# Patient Record
Sex: Female | Born: 1935 | Race: White | Hispanic: No | Smoking: Former smoker
Health system: Southern US, Community
[De-identification: ages and names within clinical notes are randomized; demographics above are authoritative.]

## PROBLEM LIST (undated history)

## (undated) DIAGNOSIS — K219 Gastro-esophageal reflux disease without esophagitis: Secondary | ICD-10-CM

## (undated) DIAGNOSIS — Q639 Congenital malformation of kidney, unspecified: Secondary | ICD-10-CM

## (undated) DIAGNOSIS — R0602 Shortness of breath: Secondary | ICD-10-CM

## (undated) DIAGNOSIS — C50919 Malignant neoplasm of unspecified site of unspecified female breast: Secondary | ICD-10-CM

## (undated) DIAGNOSIS — I214 Non-ST elevation (NSTEMI) myocardial infarction: Secondary | ICD-10-CM

## (undated) DIAGNOSIS — E119 Type 2 diabetes mellitus without complications: Secondary | ICD-10-CM

## (undated) DIAGNOSIS — G709 Myoneural disorder, unspecified: Secondary | ICD-10-CM

## (undated) DIAGNOSIS — I10 Essential (primary) hypertension: Secondary | ICD-10-CM

## (undated) DIAGNOSIS — M1990 Unspecified osteoarthritis, unspecified site: Secondary | ICD-10-CM

## (undated) DIAGNOSIS — F329 Major depressive disorder, single episode, unspecified: Secondary | ICD-10-CM

## (undated) DIAGNOSIS — J449 Chronic obstructive pulmonary disease, unspecified: Secondary | ICD-10-CM

## (undated) DIAGNOSIS — F32A Depression, unspecified: Secondary | ICD-10-CM

## (undated) HISTORY — PX: BREAST LUMPECTOMY: SHX2

## (undated) HISTORY — PX: CHOLECYSTECTOMY: SHX55

## (undated) HISTORY — PX: BACK SURGERY: SHX140

## (undated) HISTORY — PX: VULVECTOMY PARTIAL: SHX6187

---

## 1998-05-19 ENCOUNTER — Emergency Department (HOSPITAL_COMMUNITY): Admission: EM | Admit: 1998-05-19 | Discharge: 1998-05-19 | Payer: Self-pay | Admitting: Emergency Medicine

## 1998-05-20 ENCOUNTER — Encounter: Payer: Self-pay | Admitting: Gastroenterology

## 1998-05-20 ENCOUNTER — Ambulatory Visit (HOSPITAL_COMMUNITY): Admission: RE | Admit: 1998-05-20 | Discharge: 1998-05-20 | Payer: Self-pay | Admitting: Gastroenterology

## 1999-01-11 ENCOUNTER — Emergency Department (HOSPITAL_COMMUNITY): Admission: EM | Admit: 1999-01-11 | Discharge: 1999-01-11 | Payer: Self-pay | Admitting: Emergency Medicine

## 1999-07-29 ENCOUNTER — Encounter: Admission: RE | Admit: 1999-07-29 | Discharge: 1999-07-29 | Payer: Self-pay | Admitting: Internal Medicine

## 1999-07-29 ENCOUNTER — Encounter: Payer: Self-pay | Admitting: Internal Medicine

## 1999-10-28 ENCOUNTER — Encounter: Admission: RE | Admit: 1999-10-28 | Discharge: 2000-01-26 | Payer: Self-pay | Admitting: Internal Medicine

## 2001-02-17 ENCOUNTER — Other Ambulatory Visit: Admission: RE | Admit: 2001-02-17 | Discharge: 2001-02-17 | Payer: Self-pay | Admitting: Internal Medicine

## 2001-02-21 ENCOUNTER — Encounter: Admission: RE | Admit: 2001-02-21 | Discharge: 2001-02-21 | Payer: Self-pay | Admitting: Internal Medicine

## 2001-02-21 ENCOUNTER — Encounter: Payer: Self-pay | Admitting: Internal Medicine

## 2002-02-22 ENCOUNTER — Encounter: Payer: Self-pay | Admitting: Internal Medicine

## 2002-02-22 ENCOUNTER — Encounter: Admission: RE | Admit: 2002-02-22 | Discharge: 2002-02-22 | Payer: Self-pay | Admitting: Internal Medicine

## 2003-02-25 ENCOUNTER — Encounter: Admission: RE | Admit: 2003-02-25 | Discharge: 2003-02-25 | Payer: Self-pay | Admitting: Internal Medicine

## 2003-07-16 ENCOUNTER — Other Ambulatory Visit: Admission: RE | Admit: 2003-07-16 | Discharge: 2003-07-16 | Payer: Self-pay | Admitting: Internal Medicine

## 2004-03-03 ENCOUNTER — Encounter: Admission: RE | Admit: 2004-03-03 | Discharge: 2004-03-03 | Payer: Self-pay | Admitting: Internal Medicine

## 2004-06-13 ENCOUNTER — Observation Stay (HOSPITAL_COMMUNITY): Admission: EM | Admit: 2004-06-13 | Discharge: 2004-06-14 | Payer: Self-pay | Admitting: Emergency Medicine

## 2005-03-29 ENCOUNTER — Encounter: Admission: RE | Admit: 2005-03-29 | Discharge: 2005-03-29 | Payer: Self-pay | Admitting: Internal Medicine

## 2005-08-25 ENCOUNTER — Other Ambulatory Visit: Admission: RE | Admit: 2005-08-25 | Discharge: 2005-08-25 | Payer: Self-pay | Admitting: Internal Medicine

## 2006-04-05 ENCOUNTER — Encounter: Admission: RE | Admit: 2006-04-05 | Discharge: 2006-04-05 | Payer: Self-pay | Admitting: Internal Medicine

## 2006-04-11 ENCOUNTER — Inpatient Hospital Stay (HOSPITAL_COMMUNITY): Admission: EM | Admit: 2006-04-11 | Discharge: 2006-04-13 | Payer: Self-pay | Admitting: Emergency Medicine

## 2006-06-08 ENCOUNTER — Encounter: Admission: RE | Admit: 2006-06-08 | Discharge: 2006-06-08 | Payer: Self-pay | Admitting: Internal Medicine

## 2008-02-14 ENCOUNTER — Encounter: Admission: RE | Admit: 2008-02-14 | Discharge: 2008-02-14 | Payer: Self-pay | Admitting: Internal Medicine

## 2009-02-14 ENCOUNTER — Encounter: Admission: RE | Admit: 2009-02-14 | Discharge: 2009-02-14 | Payer: Self-pay | Admitting: Internal Medicine

## 2010-05-10 ENCOUNTER — Encounter: Payer: Self-pay | Admitting: Internal Medicine

## 2010-09-04 NOTE — H&P (Signed)
NAMEVERGINIA, TOOHEY NO.:  [id number]   MEDICAL RECORD NO.:  [id number]          PATIENT TYPE:  EMS   LOCATION:  ED                           FACILITY:  [facility]   PHYSICIAN:  Theressa Millard, M.D.    DATE OF BIRTH:  11/06/1927   DATE OF ADMISSION:  06/13/2004  DATE OF DISCHARGE:                                HISTORY & PHYSICAL   Ms. Golz is a very nice 77 year old white female with some left arm,  face, and leg symptoms.   For the last six weeks she has had a nightly pins and needles sensation in  the left upper arm after supper.  It would last from 7 p.m. till bedtime and  it is always gone the next day.  She swims four times a week and has never  had any of this discomfort while exercising.  Last night she noted the  discomfort was more burning in nature in the arm and she also had noted a  pins and needles sensation in the left face and the left leg.  She came to  the emergency department the sensation was getting better.  When she was  given nitroglycerin, the discomfort went away.  Emergency department  physician thought this could be her heart.  EKG with discomfort was  negative.  Serial troponins are negative.  Cardiovascular risk factors  include age, diabetes, hypertension, and former smoking status (quit 1996).   PAST MEDICAL HISTORY:   MEDICAL:  1.  Diabetes.  2.  Hypertension.  3.  Hypercholesterolemia.  4.  Asthma.  5.  Bell palsy.  6.  Shingles.   MEDICATIONS:  1.  Metformin 500 mg b.i.d.  2.  Triamterene/HCTZ 75/50 daily.  3.  Zocor 80 mg one half tablet daily.  4.  Advair 250/50 b.i.d.  She stopped taking aspirin one week ago because she read in Prevention  Magazine that it was dangerous..   SURGERY:  1.  Vulvectomy for vulvar cancer.  2.  Breast lumpectomy for breast cancer (subsequent radiation therapy).  3.  Cholecystectomy.  4.  Tonsillectomy.   No known drug allergies.   SOCIAL HISTORY:  Smoking, quit 1996.  Alcohol, quit  ten years ago.  Been  widowed x 10 years.   FAMILY HISTORY:  No heart disease.  Positive for hypertension.   REVIEW OF SYSTEMS:  Otherwise negative.   PHYSICAL EXAMINATION:  GENERAL:  Well-developed, well-nourished, in no  distress.  VITAL SIGNS:  Blood pressure 115/50, pulse 67.  HEENT:  She does have pupils which are round, reactive to light.  The  extraocular movements are intact.  Funduscopic exam is normal.  Ears are  normal.  Nose and throat are unremarkable.  NECK:  Supple.  Thyroid is not enlarged or tender.  Neck reveals no bruits.  CHEST:  Clear to auscultation percussion.  CARDIAC:  Regular rhythm rate with a normal S1 and S2 without an S3, S4,  murmur, rub, or click.  ABDOMEN:  Soft and nontender with normal bowel sounds.  EXTREMITIES:  Without cyanosis, clubbing, or edema.  NEUROLOGIC:  Alert and oriented x  3.  Cranial nerves are intact.  Motor is  5/5.   LABORATORY DATA:  Troponin x 3 is negative.  Glucose 143, otherwise normal C-  MET.  CBC is negative.   IMPRESSION:  1.  Recurrent left arm tingling with radiation to the jaw and leg.  I think      this is very unlikely to be her heart since it radiated to her leg, it      lasts for hours on a repetitive basis, and is not exertional.  I am not      sure if we will find a cause but it is more likely cerebrovascular if      anything.  We will obtain an MRI/MRA and carotid Doppler studies and add      aspirin today.  2.  Diabetes.  3.  Hypertension.  4.  Asthma.      JO/MEDQ  D:  06/13/2004  T:  06/13/2004  Job:  [id number]

## 2010-09-04 NOTE — H&P (Signed)
NAMEANALUCIA, HUSH              ACCOUNT NO.:  [id number]   MEDICAL RECORD NO.:  [id number]          PATIENT TYPE:  INP   LOCATION:  0103                         FACILITY:  [facility]   PHYSICIAN:  Hal T. Stoneking, M.D. DATE OF BIRTH:  August 12, 1927   DATE OF ADMISSION:  04/11/2006  DATE OF DISCHARGE:                              HISTORY & PHYSICAL   IDENTIFYING DATA:  [name] is a very nice 77 year old white female  who has had a background history of COPD.  She stop smoking a number of  years ago. She was in her usual state of health until 3-4 days ago when  she was becoming more dyspneic, had a cough. Was seen in the office,  placed on Xopenex and a Z-Pak.  Since then, she has had gradual  worsening dyspnea, coughing up whitish sputum.  She has felt warm at  times but no definite fever.  She states that she gets short of breath  with really just sitting up or walking very short distances.   ALLERGIES:  She has had no known drug allergies   CURRENT MEDICATIONS:  1. Pepcid AC one p.o. b.i.d.  2. Metformin 500 mg p.o. b.i.d.  3. Zocor 40 mg daily.  4. Advair 250/50 b.i.d..  5. Aspirin 325 once a day.  6. Diovan.  7. Hydrochlorothiazide 160/25 once a day.  8. Lexapro 5 mg a day.   PAST MEDICAL HISTORY:  Remarkable for:  1. Diabetes mellitus.  2. Hypercholesterolemia.  3. COPD.  4. Hypertension.  5. History of left breast cancer.  6. GERD.  7. Depression   PREVIOUS SURGERIES:  1. She had a vulvectomy for vulvar cancer, followed at [facility], original      surgery 7 years ago.  2. Lumbar fusion in 1993.  3. Temporal artery biopsy in 1993, positive for giant cell arteritis.  4. History of cholecystectomy in 1996.   FAMILY HISTORY:  Remarkable for hypertension.   SOCIAL HISTORY:  She is originally from [place].  She has been widowed  12 years. Daughter lives here in [place]   REVIEW OF SYSTEMS:  No change in her vision.  No change in her hearing.  No trouble chewing or  swallowing.  No abdominal pain.  Bowels moving  normally.  No dysuria.   PHYSICAL EXAMINATION:  VITAL SIGNS: Blood pressure 120/55, pulse rate  87, O2 saturation 97%, temperature 98.2.  SKIN: Warm and dry.  HEENT: Pupils equal, round, reactive to light. Poorly visualized fundi.  TMs appear normal.  Oropharynx moist.  LUNGS: Bilateral expiratory wheezes.  HEART: Regular rate and rhythm without murmur.  ABDOMEN: Soft, nontender.  No pedal splenomegaly or masses palpated.  No  edema.   LABORATORY DATA:  Chest x-ray reveals no active disease.   White count 7200 with 72% neutrophils, 17% lymphs, hemoglobin 13.1,  platelet count 307,000.  Sodium 138, potassium 3.6, chloride 97, bicarb  31, glucose 107, BUN 20, creatinine 0.9, calcium 9.2   ASSESSMENT:  Chronic obstructive pulmonary disease exacerbation, unclear  of what the trigger is. At this point, it does not appear to be  infectious  but, due to the degree of wheezing, will continue to cover  her and change to Levaquin.  Will continue Xopenex and will start her on  IV Solu-Medrol.   PLAN:  Admit as mentioned above.           ______________________________  Hal T. Pete Glatter, M.D.     HTS/MEDQ  D:  04/11/2006  T:  04/11/2006  Job:  [id number]

## 2010-09-04 NOTE — Discharge Summary (Signed)
[name], [name]              ACCOUNT NO.:  [id number]   MEDICAL RECORD NO.:  [id number]          PATIENT TYPE:  INP   LOCATION:  0484                         FACILITY:  [facility]   PHYSICIAN:  Candyce Churn, M.D.DATE OF BIRTH:  1928-01-14   DATE OF ADMISSION:  06/13/2004  DATE OF DISCHARGE:  06/14/2004                                 DISCHARGE SUMMARY   DISCHARGE DIAGNOSES:  1.  Transient ischemic attack.  2.  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  3.  Type 2 diabetes mellitus.  4.  Hypertension.  5.  History of hypercholesterolemia.  6.  Chronic obstructive pulmonary disease/asthma.  7.  History of Bell's palsy.  8.  History of shingles.   DISCHARGE MEDICATIONS:  1.  Metformin 500 mg p.o. b.i.d.  2.  Maxzide 75/51 daily.  3.  Zocor 80 mg 1/2 tablet daily.  4.  Advair 250/50 1 inhalation b.i.d.  5.  Aspirin 325 mg 1 daily.   PROCEDURES:  1.  MRI/MRA of the brain -- signs of mild atrophy.  Only abnormality was a      mild to moderate stenosis in the posterior branch of the right middle      cerebral artery.  There were scattered 3-4 mm white matter hyper      intensity bilaterally with the appearance of chronic infarcts.  No      evidence of metastatic disease.  Brain sinuses were clear.  2.  Carotid artery Dopplers -- right moderate diffuse disease throughout,      left moderate calcific plaque bilaterally but no ICA stenosis      bilaterally.  Vertebral artery was antegrade.  She did have a left      external carotid artery stenosis.   LABORATORY DATA:  June 13, 2004.  Sodium 134, potassium 2.9, chloride  101, bicarb 27, glucose 143, BUN 18, creatinine 1.2, calcium 9.2.  LFTs  within normal limits.  Albumin 3.5.  CBC:  White count 7,800, hemoglobin  12.8, platelet count 315,000, normal differential with 57% neutrophils.  CK  x3 was 67, 57, and 59 respectively.  All Troponins were less than 0.05.  EKG on admission revealed  sinus rhythm at 79 beats per minute and normal  EKG, normal intervals and axes.   HOSPITAL COURSE:  [name] is a very pleasant 77 year old female  with the above medical history who presented with pins and needles sensation  in her left upper arm.  These had been occurring intermittently over about 6  weeks.  She noticed a burning discomfort in her arm, left face and left leg.  On the night prior to admission she came to the emergency room for  evaluation.  She was given nitroglycerine and discomfort went away.  Serial  enzymes have been negative, EKG was normal, Troponins were negative.   She has been asymptomatic for 24 hours in the hospital with the addition of  aspirin added to her medical regimen.  She has been off it for about a week.   She has no current complaints and I suspect that  she was having some TIA  symptoms, probably from the non use of her aspirin.  We will restart this  daily.  She will notify me if she starts to develop any dyspeptic symptoms.  We will plan to see her back in the office for routine followup in 3 or 4  weeks.      RNG/MEDQ  D:  06/14/2004  T:  06/14/2004  Job:  [id number]

## 2010-09-04 NOTE — Discharge Summary (Signed)
NAMEASHAYA, RAFTERY              ACCOUNT NO.:  [id number]   MEDICAL RECORD NO.:  [id number]          PATIENT TYPE:  INP   LOCATION:  1422                         FACILITY:  [facility]   PHYSICIAN:  Georgann Housekeeper, MD      DATE OF BIRTH:  1927-10-11   DATE OF ADMISSION:  04/11/2006  DATE OF DISCHARGE:  04/13/2006                               DISCHARGE SUMMARY   DISCHARGE DIAGNOSIS:  1. Chronic obstructive pulmonary disease exacerbation.  2. Probable bronchitis.  3. Diabetes, mildly elevated blood sugar.  4. History of hypertension.  5. History of gastroesophageal reflux disease.   DISCHARGE MEDICATIONS:  1. Metformin 500 mg t.i.d.  2. Zocor 40 mg __________.  3. Advair 250/50 t.i.d.  4. Pepcid AC 2 tablets per day.  5. Diovan 160/25 one a day.  6. Lexapro 5 mg daily.  7. Nebulizer Atrovent albuterol 4 times a day.  8. Prednisone 60 mg taper d own over the next one week.  9. Oxygen 2 liters nasal cannula as needed.  10.Mucinex one tablet twice a day over-the-counter.  11.Levaquin 500 mg once a day for 7 days.   HOSPITAL COURSE:  A 77 year old female with history of COPD who was  treated outpatient with Xopenex and Z-pack, continues to worsen with  shortness of breath and wheezing and hypoxia.   Problem 1:  Pulmonary.  The patient was admitted with COPD exacerbation.  Chest x-ray did not show any pneumonia, however, bronchitis.  She did  not have any fevers.  Normal white count.  The patient was started on IV  Solu-Medrol and nebulizers and O2, continues to improve.  She was put on  Levaquin antibiotic for possible bronchitis and also Mucinex.  As far as  the patient continues to have some wheezing, but improved significantly.  Her oxygen remains on 2 liters, 96%.  Laboratory data:  White count of  7.2, creatinine 0.9, BUN of 20.  Sodium 138, potassium 3.6.   Problem 2:  Type 2 diabetes on metformin.  Because of the steroids,  blood sugars are around 200.  Metformin was  increased to 3 times a day  while she is on steroids and follow up with sugar outpatient.   Problem 3:  As far as her hypertension remains stable, continue on  Diovan HCT.  Follow in one week with Dr. Kevan Ny at the office.      Georgann Housekeeper, MD  Electronically Signed     KH/MEDQ  D:  04/13/2006  T:  04/13/2006  Job:  [id number]

## 2012-06-21 ENCOUNTER — Emergency Department (HOSPITAL_COMMUNITY): Payer: Medicare Other

## 2012-06-21 ENCOUNTER — Encounter (HOSPITAL_COMMUNITY): Payer: Self-pay | Admitting: Emergency Medicine

## 2012-06-21 ENCOUNTER — Other Ambulatory Visit: Payer: Self-pay

## 2012-06-21 ENCOUNTER — Inpatient Hospital Stay (HOSPITAL_COMMUNITY)
Admission: EM | Admit: 2012-06-21 | Discharge: 2012-06-28 | DRG: 193 | Disposition: A | Discharge status: Skilled Nursing Facility | Payer: Medicare Other | Attending: Internal Medicine | Admitting: Internal Medicine

## 2012-06-21 DIAGNOSIS — IMO0002 Reserved for concepts with insufficient information to code with codable children: Secondary | ICD-10-CM

## 2012-06-21 DIAGNOSIS — T50995A Adverse effect of other drugs, medicaments and biological substances, initial encounter: Secondary | ICD-10-CM | POA: Diagnosis present

## 2012-06-21 DIAGNOSIS — J9620 Acute and chronic respiratory failure, unspecified whether with hypoxia or hypercapnia: Secondary | ICD-10-CM

## 2012-06-21 DIAGNOSIS — D72829 Elevated white blood cell count, unspecified: Secondary | ICD-10-CM

## 2012-06-21 DIAGNOSIS — I10 Essential (primary) hypertension: Secondary | ICD-10-CM

## 2012-06-21 DIAGNOSIS — F19982 Other psychoactive substance use, unspecified with psychoactive substance-induced sleep disorder: Secondary | ICD-10-CM | POA: Diagnosis not present

## 2012-06-21 DIAGNOSIS — F411 Generalized anxiety disorder: Secondary | ICD-10-CM | POA: Diagnosis present

## 2012-06-21 DIAGNOSIS — E1165 Type 2 diabetes mellitus with hyperglycemia: Secondary | ICD-10-CM | POA: Diagnosis present

## 2012-06-21 DIAGNOSIS — Z87891 Personal history of nicotine dependence: Secondary | ICD-10-CM

## 2012-06-21 DIAGNOSIS — R931 Abnormal findings on diagnostic imaging of heart and coronary circulation: Secondary | ICD-10-CM | POA: Diagnosis present

## 2012-06-21 DIAGNOSIS — I214 Non-ST elevation (NSTEMI) myocardial infarction: Secondary | ICD-10-CM

## 2012-06-21 DIAGNOSIS — J441 Chronic obstructive pulmonary disease with (acute) exacerbation: Secondary | ICD-10-CM

## 2012-06-21 DIAGNOSIS — IMO0001 Reserved for inherently not codable concepts without codable children: Secondary | ICD-10-CM | POA: Diagnosis present

## 2012-06-21 DIAGNOSIS — R0603 Acute respiratory distress: Secondary | ICD-10-CM

## 2012-06-21 DIAGNOSIS — Z9981 Dependence on supplemental oxygen: Secondary | ICD-10-CM

## 2012-06-21 DIAGNOSIS — G2581 Restless legs syndrome: Secondary | ICD-10-CM | POA: Diagnosis present

## 2012-06-21 DIAGNOSIS — K1379 Other lesions of oral mucosa: Secondary | ICD-10-CM

## 2012-06-21 DIAGNOSIS — G4700 Insomnia, unspecified: Secondary | ICD-10-CM | POA: Diagnosis present

## 2012-06-21 DIAGNOSIS — R7989 Other specified abnormal findings of blood chemistry: Secondary | ICD-10-CM

## 2012-06-21 DIAGNOSIS — J189 Pneumonia, unspecified organism: Secondary | ICD-10-CM

## 2012-06-21 DIAGNOSIS — Z79899 Other long term (current) drug therapy: Secondary | ICD-10-CM

## 2012-06-21 DIAGNOSIS — E785 Hyperlipidemia, unspecified: Secondary | ICD-10-CM | POA: Diagnosis present

## 2012-06-21 DIAGNOSIS — Z7901 Long term (current) use of anticoagulants: Secondary | ICD-10-CM

## 2012-06-21 HISTORY — DX: Chronic obstructive pulmonary disease, unspecified: J44.9

## 2012-06-21 HISTORY — DX: Type 2 diabetes mellitus without complications: E11.9

## 2012-06-21 HISTORY — DX: Essential (primary) hypertension: I10

## 2012-06-21 HISTORY — DX: Malignant neoplasm of unspecified site of unspecified female breast: C50.919

## 2012-06-21 HISTORY — DX: Congenital malformation of kidney, unspecified: Q63.9

## 2012-06-21 HISTORY — DX: Non-ST elevation (NSTEMI) myocardial infarction: I21.4

## 2012-06-21 HISTORY — DX: Shortness of breath: R06.02

## 2012-06-21 LAB — BLOOD GAS, ARTERIAL
Bicarbonate: 22.1 mEq/L (ref 20.0–24.0)
TCO2: 23.2 mmol/L (ref 0–100)
pCO2 arterial: 38.6 mmHg (ref 35.0–45.0)
pH, Arterial: 7.375 (ref 7.350–7.450)

## 2012-06-21 LAB — COMPREHENSIVE METABOLIC PANEL
CO2: 26 mEq/L (ref 19–32)
Calcium: 9.6 mg/dL (ref 8.4–10.5)
GFR calc Af Amer: 50 mL/min — ABNORMAL LOW (ref >90)
Potassium: 4.1 mEq/L (ref 3.5–5.1)
Sodium: 136 mEq/L (ref 135–145)
Total Bilirubin: 0.8 mg/dL (ref 0.3–1.2)
Total Protein: 7.2 g/dL (ref 6.0–8.3)

## 2012-06-21 LAB — GLUCOSE, CAPILLARY
Glucose-Capillary: 265 mg/dL — ABNORMAL HIGH (ref 70–99)
Glucose-Capillary: 341 mg/dL — ABNORMAL HIGH (ref 70–99)
Glucose-Capillary: 270 mg/dL — ABNORMAL HIGH (ref 70–99)

## 2012-06-21 LAB — CBC WITH DIFFERENTIAL/PLATELET
Basophils Absolute: 0.1 10*3/uL (ref 0.0–0.1)
Basophils Relative: 0 % (ref 0–1)
Eosinophils Relative: 1 % (ref 0–5)
HCT: 37.5 % (ref 36.0–46.0)
MCH: 29.5 pg (ref 26.0–34.0)
Monocytes Absolute: 1.3 10*3/uL — ABNORMAL HIGH (ref 0.1–1.0)
Neutro Abs: 14.1 10*3/uL — ABNORMAL HIGH (ref 1.7–7.7)
Neutrophils Relative %: 74 % (ref 43–77)
RBC: 4.31 MIL/uL (ref 3.87–5.11)

## 2012-06-21 LAB — POCT I-STAT 3, ART BLOOD GAS (G3+)
Bicarbonate: 27.4 mEq/L — ABNORMAL HIGH (ref 20.0–24.0)
O2 Saturation: 98 %
TCO2: 29 mmol/L (ref 0–100)
pCO2 arterial: 56.5 mmHg — ABNORMAL HIGH (ref 35.0–45.0)
pH, Arterial: 7.294 — ABNORMAL LOW (ref 7.350–7.450)
pO2, Arterial: 128 mmHg — ABNORMAL HIGH (ref 80.0–100.0)

## 2012-06-21 LAB — TROPONIN I
Troponin I: 0.4 ng/mL (ref <0.30)
Troponin I: 0.74 ng/mL (ref <0.30)

## 2012-06-21 LAB — HEMOGLOBIN A1C
Hgb A1c MFr Bld: 7.3 % — ABNORMAL HIGH (ref <5.7)
Mean Plasma Glucose: 163 mg/dL — ABNORMAL HIGH (ref <117)

## 2012-06-21 LAB — CG4 I-STAT (LACTIC ACID): Lactic Acid, Venous: 2.05 mmol/L (ref 0.5–2.2)

## 2012-06-21 LAB — CK TOTAL AND CKMB (NOT AT ARMC): CK, MB: 19 ng/mL (ref 0.3–4.0)

## 2012-06-21 MED ORDER — FUROSEMIDE 10 MG/ML IJ SOLN
40.0000 mg | Freq: Once | INTRAMUSCULAR | Status: AC
  Administered 2012-06-21: 40 mg via INTRAVENOUS
  Filled 2012-06-21: qty 4

## 2012-06-21 MED ORDER — ONDANSETRON HCL 4 MG/2ML IJ SOLN: 4.0000 mg | Freq: Four times a day (QID) | INTRAMUSCULAR | Status: DC | PRN

## 2012-06-21 MED ORDER — IPRATROPIUM BROMIDE 0.02 % IN SOLN
RESPIRATORY_TRACT | Status: AC
  Filled 2012-06-21: qty 2.5

## 2012-06-21 MED ORDER — SODIUM CHLORIDE 0.9 % IJ SOLN
3.0000 mL | Freq: Two times a day (BID) | INTRAMUSCULAR | Status: DC
  Administered 2012-06-21 – 2012-06-28 (×15): 3 mL via INTRAVENOUS

## 2012-06-21 MED ORDER — ACETAMINOPHEN 650 MG RE SUPP: 650.0000 mg | Freq: Four times a day (QID) | RECTAL | Status: DC | PRN

## 2012-06-21 MED ORDER — GUAIFENESIN-DM 100-10 MG/5ML PO SYRP
5.0000 mL | ORAL_SOLUTION | ORAL | Status: DC | PRN
  Filled 2012-06-21 (×2): qty 5

## 2012-06-21 MED ORDER — ENOXAPARIN SODIUM 40 MG/0.4ML ~~LOC~~ SOLN
40.0000 mg | SUBCUTANEOUS | Status: DC
  Administered 2012-06-21 – 2012-06-28 (×8): 40 mg via SUBCUTANEOUS
  Filled 2012-06-21 (×8): qty 0.4

## 2012-06-21 MED ORDER — INSULIN ASPART 100 UNIT/ML ~~LOC~~ SOLN
0.0000 [IU] | Freq: Three times a day (TID) | SUBCUTANEOUS | Status: DC
  Administered 2012-06-21 (×2): 5 [IU] via SUBCUTANEOUS
  Administered 2012-06-22: 2 [IU] via SUBCUTANEOUS
  Administered 2012-06-22 (×2): 3 [IU] via SUBCUTANEOUS
  Administered 2012-06-23: 5 [IU] via SUBCUTANEOUS
  Administered 2012-06-23: 3 [IU] via SUBCUTANEOUS
  Administered 2012-06-23 – 2012-06-24 (×2): 2 [IU] via SUBCUTANEOUS
  Administered 2012-06-24 (×2): 3 [IU] via SUBCUTANEOUS
  Administered 2012-06-25: 5 [IU] via SUBCUTANEOUS
  Administered 2012-06-25: 3 [IU] via SUBCUTANEOUS
  Administered 2012-06-25: 2 [IU] via SUBCUTANEOUS
  Administered 2012-06-26: 3 [IU] via SUBCUTANEOUS
  Administered 2012-06-26: 1 [IU] via SUBCUTANEOUS
  Administered 2012-06-26: 3 [IU] via SUBCUTANEOUS
  Administered 2012-06-27: 5 [IU] via SUBCUTANEOUS
  Administered 2012-06-27: 2 [IU] via SUBCUTANEOUS
  Administered 2012-06-28: 5 [IU] via SUBCUTANEOUS

## 2012-06-21 MED ORDER — AZITHROMYCIN 250 MG PO TABS
500.0000 mg | ORAL_TABLET | Freq: Once | ORAL | Status: AC
  Administered 2012-06-21: 500 mg via ORAL
  Filled 2012-06-21: qty 2

## 2012-06-21 MED ORDER — INSULIN GLARGINE 100 UNIT/ML ~~LOC~~ SOLN
5.0000 [IU] | Freq: Once | SUBCUTANEOUS | Status: AC
  Administered 2012-06-21: 5 [IU] via SUBCUTANEOUS

## 2012-06-21 MED ORDER — METHYLPREDNISOLONE SODIUM SUCC 125 MG IJ SOLR: 60.0000 mg | Freq: Three times a day (TID) | INTRAMUSCULAR | Status: DC

## 2012-06-21 MED ORDER — ACETAMINOPHEN 325 MG PO TABS
650.0000 mg | ORAL_TABLET | Freq: Four times a day (QID) | ORAL | Status: DC | PRN
  Administered 2012-06-24 – 2012-06-25 (×2): 650 mg via ORAL
  Filled 2012-06-21 (×2): qty 2

## 2012-06-21 MED ORDER — ALBUTEROL (5 MG/ML) CONTINUOUS INHALATION SOLN
INHALATION_SOLUTION | RESPIRATORY_TRACT | Status: AC
  Filled 2012-06-21: qty 20

## 2012-06-21 MED ORDER — INSULIN ASPART 100 UNIT/ML ~~LOC~~ SOLN
3.0000 [IU] | Freq: Three times a day (TID) | SUBCUTANEOUS | Status: DC
  Administered 2012-06-22 – 2012-06-25 (×9): 3 [IU] via SUBCUTANEOUS
  Administered 2012-06-25: 10:00:00 via SUBCUTANEOUS
  Administered 2012-06-26 – 2012-06-27 (×6): 3 [IU] via SUBCUTANEOUS
  Administered 2012-06-28: 12:00:00 via SUBCUTANEOUS
  Administered 2012-06-28: 3 [IU] via SUBCUTANEOUS

## 2012-06-21 MED ORDER — ASPIRIN EC 325 MG PO TBEC
325.0000 mg | DELAYED_RELEASE_TABLET | Freq: Every day | ORAL | Status: DC
  Administered 2012-06-22 – 2012-06-28 (×7): 325 mg via ORAL
  Filled 2012-06-21 (×7): qty 1

## 2012-06-21 MED ORDER — ALBUTEROL (5 MG/ML) CONTINUOUS INHALATION SOLN
10.0000 mg/h | INHALATION_SOLUTION | RESPIRATORY_TRACT | Status: DC
  Administered 2012-06-21: 10 mg/h via RESPIRATORY_TRACT

## 2012-06-21 MED ORDER — METHYLPREDNISOLONE SODIUM SUCC 125 MG IJ SOLR
60.0000 mg | Freq: Four times a day (QID) | INTRAMUSCULAR | Status: DC
  Administered 2012-06-21 – 2012-06-22 (×5): 60 mg via INTRAVENOUS
  Filled 2012-06-21 (×8): qty 0.96

## 2012-06-21 MED ORDER — ASPIRIN 81 MG PO CHEW
324.0000 mg | CHEWABLE_TABLET | Freq: Once | ORAL | Status: AC
  Administered 2012-06-21: 324 mg via ORAL
  Filled 2012-06-21: qty 1
  Filled 2012-06-21: qty 4

## 2012-06-21 MED ORDER — INSULIN GLARGINE 100 UNIT/ML ~~LOC~~ SOLN
5.0000 [IU] | Freq: Every day | SUBCUTANEOUS | Status: DC
  Administered 2012-06-21: 5 [IU] via SUBCUTANEOUS

## 2012-06-21 MED ORDER — ALBUTEROL SULFATE (5 MG/ML) 0.5% IN NEBU
2.5000 mg | INHALATION_SOLUTION | Freq: Four times a day (QID) | RESPIRATORY_TRACT | Status: DC
  Administered 2012-06-21 – 2012-06-22 (×5): 2.5 mg via RESPIRATORY_TRACT
  Filled 2012-06-21 (×5): qty 0.5

## 2012-06-21 MED ORDER — INSULIN GLARGINE 100 UNIT/ML ~~LOC~~ SOLN
10.0000 [IU] | Freq: Every day | SUBCUTANEOUS | Status: DC
Start: 2012-06-22 — End: 2012-06-24
  Administered 2012-06-22 – 2012-06-24 (×3): 10 [IU] via SUBCUTANEOUS

## 2012-06-21 MED ORDER — DEXTROSE 5 % IV SOLN
500.0000 mg | Freq: Once | INTRAVENOUS | Status: DC
  Filled 2012-06-21: qty 500

## 2012-06-21 MED ORDER — METHYLPREDNISOLONE SODIUM SUCC 125 MG IJ SOLR: 125.0000 mg | Freq: Once | INTRAMUSCULAR | Status: DC

## 2012-06-21 MED ORDER — ALBUTEROL SULFATE (5 MG/ML) 0.5% IN NEBU: 2.5000 mg | INHALATION_SOLUTION | RESPIRATORY_TRACT | Status: DC | PRN

## 2012-06-21 MED ORDER — INSULIN ASPART 100 UNIT/ML ~~LOC~~ SOLN
0.0000 [IU] | Freq: Every day | SUBCUTANEOUS | Status: DC
Start: 2012-06-21 — End: 2012-06-28
  Administered 2012-06-21: 2 [IU] via SUBCUTANEOUS
  Administered 2012-06-23: 3 [IU] via SUBCUTANEOUS
  Administered 2012-06-25: 2 [IU] via SUBCUTANEOUS

## 2012-06-21 MED ORDER — DEXTROSE 5 % IV SOLN
500.0000 mg | INTRAVENOUS | Status: DC
  Administered 2012-06-22 – 2012-06-23 (×2): 500 mg via INTRAVENOUS
  Filled 2012-06-21 (×2): qty 500

## 2012-06-21 MED ORDER — ONDANSETRON HCL 4 MG PO TABS: 4.0000 mg | ORAL_TABLET | Freq: Four times a day (QID) | ORAL | Status: DC | PRN

## 2012-06-21 MED ORDER — SODIUM CHLORIDE 0.9 % IV SOLN
INTRAVENOUS | Status: AC
  Administered 2012-06-21: 11:00:00 via INTRAVENOUS

## 2012-06-21 MED ORDER — GUAIFENESIN ER 600 MG PO TB12
600.0000 mg | ORAL_TABLET | Freq: Two times a day (BID) | ORAL | Status: DC
  Administered 2012-06-21 – 2012-06-28 (×15): 600 mg via ORAL
  Filled 2012-06-21 (×16): qty 1

## 2012-06-21 MED ORDER — DEXTROSE 5 % IV SOLN
1.0000 g | Freq: Once | INTRAVENOUS | Status: AC
  Administered 2012-06-21: 1 g via INTRAVENOUS
  Filled 2012-06-21: qty 10

## 2012-06-21 MED ORDER — IPRATROPIUM BROMIDE 0.02 % IN SOLN
0.5000 mg | Freq: Four times a day (QID) | RESPIRATORY_TRACT | Status: DC
  Administered 2012-06-21 – 2012-06-22 (×5): 0.5 mg via RESPIRATORY_TRACT
  Filled 2012-06-21 (×5): qty 2.5

## 2012-06-21 MED ORDER — DEXTROSE 5 % IV SOLN
1.0000 g | INTRAVENOUS | Status: DC
  Administered 2012-06-22 – 2012-06-25 (×4): 1 g via INTRAVENOUS
  Filled 2012-06-21 (×4): qty 10

## 2012-06-21 NOTE — ED Notes (Signed)
To ED from home via EMS for resp dis, hx COPD, pale, diaphoretic, EMS sts no edema, has had 10mg  Alb, 0.5 Atrovent and 125 mg Solumedrol,. MD to bedside on arrival, resp dis continued, RT called

## 2012-06-21 NOTE — ED Provider Notes (Addendum)
History     CSN: [id number]  Arrival date & time 06/21/12  0612   First MD Initiated Contact with Patient 06/21/12 [phone]      Chief Complaint  Patient presents with  . Respiratory Distress    (Consider location/radiation/quality/duration/timing/severity/associated sxs/prior treatment) HPI Comments: Patient with history of copd/emphysema on home oxygen.  By ems for complaints of worsening shortness of breath since last night.  EMS found her in distress and was placed on oxygen.  She was given nebulizer treatments and steroids en route.  She denies to me she is having any chest pain, fever, or cough.    Patient of Johnella Moloney.  Patient is a 79 y.o. female presenting with shortness of breath. The history is provided by the patient and the EMS personnel.  Shortness of Breath Severity:  Severe Onset quality:  Sudden Timing:  Constant Progression:  Worsening Chronicity:  Recurrent Context comment:  Unknown Relieved by:  Nothing Worsened by:  Nothing tried Ineffective treatments:  None tried Associated symptoms: no chest pain, no cough and no fever     No past medical history on file.  No past surgical history on file.  No family history on file.  History  Substance Use Topics  . Smoking status: Not on file  . Smokeless tobacco: Not on file  . Alcohol Use: Not on file    OB History   No data available      Review of Systems  Constitutional: Negative for fever.  Respiratory: Positive for shortness of breath. Negative for cough.   Cardiovascular: Negative for chest pain.  All other systems reviewed and are negative.    Allergies  Review of patient's allergies indicates not on file.  Home Medications  No current outpatient prescriptions on file.  There were no vitals taken for this visit.  Physical Exam  Nursing note and vitals reviewed. Constitutional: She is oriented to person, place, and time. She appears well-developed.  Patient appears anxious, in  moderate to high respiratory distress.    HENT:  Head: Normocephalic and atraumatic.  Mouth/Throat: Oropharynx is clear and moist.  Neck: Normal range of motion. Neck supple.  Cardiovascular: Regular rhythm.   No murmur heard. Tachycardia  Pulmonary/Chest:  Breath sounds are coarse throughout.  There are rhonchi with inspiration and expiration.  She is diaphoretic and is in moderate to severe respiratory distress.  Abdominal: Soft. Bowel sounds are normal. She exhibits no distension. There is no tenderness.  Musculoskeletal: Normal range of motion. She exhibits no edema.  Neurological: She is alert and oriented to person, place, and time. She exhibits normal muscle tone. Coordination normal.  Skin: There is pallor.  Diaphoretic.    ED Course  Procedures (including critical care time)  Labs Reviewed  CBC WITH DIFFERENTIAL  COMPREHENSIVE METABOLIC PANEL  TROPONIN I  PRO B NATRIURETIC PEPTIDE   No results found.   No diagnosis found.   Date: 06/21/2012  Rate: 135  Rhythm: sinus tachycardia  QRS Axis: left  Intervals: normal  ST/T Wave abnormalities: nonspecific T wave changes  Conduction Disutrbances:none  Narrative Interpretation:   Old EKG Reviewed: unchanged    MDM  Patient presents here in respiratory distress that appears clinically to be a copd exacerbation.  She was given solu-medrol en route along with albuterol nebs.  She was seen here immediately by me upon arrival.  She was given an hour long neb and labs were obtained.  These revealed an elevated wbc and the chest xray shows possible  pneumonia which I treated with zithromax and rocephin.  The bnp and troponin were both elevated at 0.4 without ekg changes.  I suspect this is related to demand ischemia.  I have spoken with Dr. Sherre Lain who agrees to admit the patient to the SDU.     CRITICAL CARE Performed by: Geoffery Lyons   Total critical care time: 45 minutes  Critical care time was exclusive of  separately billable procedures and treating other patients.  Critical care was necessary to treat or prevent imminent or life-threatening deterioration.  Critical care was time spent personally by me on the following activities: development of treatment plan with patient and/or surrogate as well as nursing, discussions with consultants, evaluation of patient's response to treatment, examination of patient, obtaining history from patient or surrogate, ordering and performing treatments and interventions, ordering and review of laboratory studies, ordering and review of radiographic studies, pulse oximetry and re-evaluation of patient's condition.     Geoffery Lyons, MD 06/21/12 [id number]  Geoffery Lyons, MD 06/21/12 [phone]

## 2012-06-21 NOTE — ED Notes (Signed)
Called report to [phone], pt hypotensive on last vital check, fluids opened up and admitting MD paged

## 2012-06-21 NOTE — Progress Notes (Signed)
CRITICAL VALUE ALERT  Critical value received:  Troponin 0.74  Date of notification:  06/21/2012  Time of notification:  1230  Critical value read back:yes  Nurse who received alert:  Denzil Magnuson  MD notified (1st page):  Dr. Waymon Amato  Time of first page:  1235  MD notified (2nd page):  Time of second page:  Responding MD:  Dr. Waymon Amato  Time MD responded: [phone]

## 2012-06-21 NOTE — Consult Note (Signed)
Admit date: 06/21/2012 Referring Physician  Dr. Waymon Amato Primary Physician  Dr. Kevan Ny Primary Cardiologist  [place]-new Reason for Consultation  NSTEMI  HPI: 79 year old female with chronic lung disease.  She requires home oxygen.  She took off her oxygen yesterday to get the mail.  She didn't know how cold it was.  She came back and she was very short of breath.  She felt very tired.  She had some tightness in her chest.  She stayed at home for several more hours.  She went to bed and woke up early in the morning.  She did not feel right.  She called her daughter who then called an ambulance.  She has received steroids for COPD exacerbation.  She feels much better.  She currently has no chest discomfort.  Troponin was checked and found to be mildly elevated.  An echocardiogram was done which did show a distal anteroseptal area of hypokinesis.  She has never had any significant heart testing done in the past.  She never had a cath.  She is unaware of any prior MI or coronary artery disease that she may have.      PMH:   Past Medical History  Diagnosis Date  . COPD (chronic obstructive pulmonary disease)   . Diabetes mellitus without complication   . Hypertension   . Breast cancer treated with RT  . Shortness of breath   . 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 Back surgery    . Vulvectomy partial    . Cholecystectomy    . Breast lumpectomy Left     Allergies:  Review of patient's allergies indicates no known allergies. Prior to Admit Meds:   Prescriptions prior to admission  Medication Sig Dispense Refill  . aspirin EC 81 MG tablet Take 81 mg by mouth daily.      . Fluticasone-Salmeterol (ADVAIR DISKUS) 250-50 MCG/DOSE AEPB Inhale 1 puff into the lungs every 12 (twelve) hours.      Marland Kitchen ipratropium-albuterol (DUONEB) 0.5-2.5 (3) MG/3ML SOLN Take 3 mLs by nebulization every 4 (four) hours as  needed (for wheezing and shortness of breath).      Marland Kitchen lisinopril (PRINIVIL,ZESTRIL) 10 MG tablet Take 10 mg by mouth daily.      . metFORMIN (GLUCOPHAGE) 500 MG tablet Take 500 mg by mouth 2 (two) times daily with a meal.      . simvastatin (ZOCOR) 40 MG tablet Take 40 mg by mouth every evening.      . hydrochlorothiazide (HYDRODIURIL) 25 MG tablet Take 12.5 mg by mouth daily.       Fam HX:   History reviewed. No pertinent family history. Social HX:    History   Social History  . Marital Status: Widowed    Spouse Name: N/A    Number of Children: N/A  . Years of Education: N/A   Occupational History  . Not on file.   Social History Main Topics  . Smoking status: Former Smoker    Types: Cigarettes    Quit date: 04/19/1996  . Smokeless tobacco: Never Used  . Alcohol Use: No  . Drug Use: No  . Sexually Active: Not on file   Other Topics Concern  . Not on file   Social History Narrative  . No narrative on file     ROS:  All 11 ROS were addressed and  are negative except what is stated in the HPI  Physical Exam: Blood pressure 118/58, pulse 106, temperature 97.6 F (36.4 C), temperature source Oral, resp. rate 23, height 5\' 6"  (1.676 m), weight 74.3 kg (163 lb 12.8 oz), SpO2 96.00%.    General: Mildly short of breath Head:    Normal cephalic and atramatic  Lungs:  Mild, bilateral, expiratory wheezing Heart:  Tachycardic, S1-S2 Abdomen:  abdomen soft and non-tender  Msk:   Normal strength and tone for age. Extremities:   No edema.   Neuro: Alert and oriented X 3. Psych:  Normal affect, responds appropriately    Labs:   Lab Results  Component Value Date   WBC 19.2* 06/21/2012   HGB 12.7 06/21/2012   HCT 37.5 06/21/2012   MCV 87.0 06/21/2012   PLT 366 06/21/2012    Recent Labs Lab 06/21/12 0620  NA 136  K 4.1  CL 95*  CO2 26  BUN 30*  CREATININE 1.14*  CALCIUM 9.6  PROT 7.2  BILITOT 0.8  ALKPHOS 100  ALT 52*  AST 98*  GLUCOSE 194*   No results found for  this basename: PTT   No results found for this basename: INR, PROTIME   Lab Results  Component Value Date   TROPONINI 0.74* 06/21/2012     No results found for this basename: CHOL   No results found for this basename: HDL   No results found for this basename: LDLCALC   No results found for this basename: TRIG   No results found for this basename: CHOLHDL   No results found for this basename: LDLDIRECT      Radiology:  Dg Chest Port 1 View  06/21/2012  *RADIOLOGY REPORT*  Clinical Data: Shortness of breath and cough.  PORTABLE CHEST - 1 VIEW  Comparison: Chest radiograph performed 06/12/2010  Findings: The lungs are mildly hyperexpanded, with flattening of the hemidiaphragms, likely reflecting COPD.  Vascular congestion is noted, with peribronchial thickening.  Apparent mild bibasilar and right mid lung opacities may reflect atelectasis or possibly mild pneumonia.  No definite pleural effusion or pneumothorax is seen, though the right costophrenic angle is incompletely imaged on this study.  The cardiomediastinal silhouette is normal in size; calcification is noted within the aortic arch.  No acute osseous abnormalities are seen.  IMPRESSION:  1.  Apparent mild bibasilar and right mid lung opacities may reflect atelectasis or possibly mild pneumonia. 2.  Findings suggest COPD.  Vascular congestion noted, with peribronchial thickening.   Original Report Authenticated By: Tonia Ghent, M.D.     EKG:  Sinus tachycardia, no clear ST segment changes  ASSESSMENT: Non-ST elevation MI in the setting of hypoxia and COPD exacerbation  PLAN:  Will add on CK and MB blood tests to the previously drawn troponins.  She does have a wall motion and her malleoli on echocardiogram but is currently asymptomatic.  She is not showing any signs of heart failure.  She has no angina.  Given her overall status with her severe lung disease, I don't think any further invasive cardiac workup is warranted.  She states  that at home, she usually just sits in a chair.  She is not reactive.  She has not had angina typically.  She likely does have a degree of coronary artery disease I recommend medical therapy.  Would not heparinize at this point.  Continue treatment of COPD exacerbation.  Avoid hypoxemia to avoid cardiac ischemia.   would use aspirin 81 mg daily.  Could  consider adding Plavix and/or statin.  Would not use beta blocker given her severe lung disease.  I think her most limiting health problem will be her lungs.  Corky Crafts., MD  06/21/2012  5:41 PM

## 2012-06-21 NOTE — ED Notes (Signed)
Pt sts she wears 2L O2 at home

## 2012-06-21 NOTE — H&P (Signed)
[facility] History and Physical  [name] ZOX:[id number] DOB: April 24, 1927 DOA: 06/21/2012  Referring physician: Dr. Geoffery Lyons, EDP PCP: Pearla Dubonnet, MD  Specialists:  None  Chief Complaint: Worsening difficulty breathing  HPI: [name] is a 79 y.o. female with PMH of chronic oxygen-dependent (2 L per minute) respiratory failure, COPD, type II DM, HTN, HL, breast cancer status post radiation brought to the ED by EMS on 06/21/12 with complaints of worsening dyspnea. Patient indicates that she was in her usual state of health until ~11 AM on 06/20/12. She briefly took off her oxygen and stepped out of her house for a few minutes where it was extremely cold. On returning in, she started having worsening dyspnea. Since then the dyspnea has progressively gotten worse. This was associated with wheezing, chest tightness, orthopnea and some lower substernal "soreness". She has mild intermittent cough which is nonproductive. She denies fever or chills. The chest soreness she rates as 5/10, nonradiating and cannot give any further details and has resolved. She received Solu-Medrol 125 mg on route to ED. In the ED she was in acute respiratory distress, pH 7.294, PCO2 57, PO2 128, oxygen saturation 98%, troponin 0.4, BNP 1885, venous lactate 2.05, white blood cell 19.2 and chest x-ray read as mild bibasal and right mid lung opacities which may reflect atelectasis or possible mild pneumonia, COPD and vascular congestion. She received IV Rocephin, azithromycin, aspirin 324 mg and albuterol nebulization. She states that she feels a whole lot better with her dyspnea significantly less. She had transient drop in her blood pressure to the 80s-asymptomatic and is improving. The hospitalist service is requested to admit for further evaluation and management   Review of Systems: All systems reviewed and apart from history of presenting illness, are negative  Past Medical History  Diagnosis Date   . COPD (chronic obstructive pulmonary disease)   . Diabetes mellitus without complication   . Hypertension   . Breast cancer treated with RT   Past Surgical History  Procedure Laterality Date  . Back surgery    . Vulvectomy partial    . Cholecystectomy     Social History:  reports that she has quit smoking. Her smoking use included Cigarettes. She smoked 0.00 packs per day. She has never used smokeless tobacco. She reports that she does not drink alcohol or use illicit drugs. Patient is widowed. Lives alone and is independent of activities of daily living.  No Known Allergies  History reviewed. No pertinent family history. reviewed family history with patient in detail and is negative.  Prior to Admission medications   Medication Sig Start Date End Date Taking? Authorizing Jemya Depierro  aspirin EC 81 MG tablet Take 81 mg by mouth daily.   Yes Historical Alaine Loughney, MD  Fluticasone-Salmeterol (ADVAIR DISKUS) 250-50 MCG/DOSE AEPB Inhale 1 puff into the lungs every 12 (twelve) hours.   Yes Historical Elma Shands, MD  ipratropium-albuterol (DUONEB) 0.5-2.5 (3) MG/3ML SOLN Take 3 mLs by nebulization every 4 (four) hours as needed (for wheezing and shortness of breath).   Yes Historical Britainy Kozub, MD  metFORMIN (GLUCOPHAGE) 500 MG tablet Take 500 mg by mouth 2 (two) times daily with a meal.   Yes Historical Noami Bove, MD   Physical Exam: Filed Vitals:   06/21/12 0806 06/21/12 0810 06/21/12 0831 06/21/12 0832  BP: 78/42 80/38 88/62    Pulse:   115   Temp:    98 F (36.7 C)  TempSrc:    Oral  Resp:   18  SpO2:   97%      General exam: Moderately built and nourished female patient, lying comfortably supine on the gurney in mild respiratory distress.  Head, eyes and ENT: Nontraumatic and normocephalic. Pupils equally reacting to light and accommodation. Oral mucosa moist.  Neck: Supple. No JVD, carotid bruit or thyromegaly.  Lymphatics: No lymphadenopathy.  Respiratory system: Reduced  breath sounds bilaterally with bilateral scattered expiratory medium pitched rhonchi. Mild increased work of breathing. Able to speak in full sentences.  Cardiovascular system: S1 and S2 heard, regularly and mildly tachycardic. No JVD, murmurs, gallops, clicks or pedal edema.  Gastrointestinal system: Abdomen is nondistended, soft and nontender. Normal bowel sounds heard. No organomegaly or masses appreciated.  Central nervous system: Alert and oriented. No focal neurological deficits.  Extremities: Symmetric 5 x 5 power. Peripheral pulses symmetrically felt.  Skin: No rashes or acute findings on exam of exposed skin.  Musculoskeletal system: Negative exam.  Psychiatry: Pleasant and cooperative.   Labs on Admission:  Basic Metabolic Panel:  Recent Labs Lab 06/21/12 0620  NA 136  K 4.1  CL 95*  CO2 26  GLUCOSE 194*  BUN 30*  CREATININE 1.14*  CALCIUM 9.6   Liver Function Tests:  Recent Labs Lab 06/21/12 0620  AST 98*  ALT 52*  ALKPHOS 100  BILITOT 0.8  PROT 7.2  ALBUMIN 3.6   No results found for this basename: LIPASE, AMYLASE,  in the last 168 hours No results found for this basename: AMMONIA,  in the last 168 hours CBC:  Recent Labs Lab 06/21/12 0620  WBC 19.2*  NEUTROABS 14.1*  HGB 12.7  HCT 37.5  MCV 87.0  PLT 366   Cardiac Enzymes:  Recent Labs Lab 06/21/12 0621  TROPONINI 0.40*    BNP (last 3 results)  Recent Labs  06/21/12 0621  PROBNP 1885.0*   CBG: No results found for this basename: GLUCAP,  in the last 168 hours  Radiological Exams on Admission: Dg Chest Port 1 View  06/21/2012  *RADIOLOGY REPORT*  Clinical Data: Shortness of breath and cough.  PORTABLE CHEST - 1 VIEW  Comparison: Chest radiograph performed 06/12/2010  Findings: The lungs are mildly hyperexpanded, with flattening of the hemidiaphragms, likely reflecting COPD.  Vascular congestion is noted, with peribronchial thickening.  Apparent mild bibasilar and right mid lung  opacities may reflect atelectasis or possibly mild pneumonia.  No definite pleural effusion or pneumothorax is seen, though the right costophrenic angle is incompletely imaged on this study.  The cardiomediastinal silhouette is normal in size; calcification is noted within the aortic arch.  No acute osseous abnormalities are seen.  IMPRESSION:  1.  Apparent mild bibasilar and right mid lung opacities may reflect atelectasis or possibly mild pneumonia. 2.  Findings suggest COPD.  Vascular congestion noted, with peribronchial thickening.   Original Report Authenticated By: Tonia Ghent, M.D.     EKG: Independently reviewed. Sinus tachycardia at 135 beats per minute and no acute changes.  Assessment/Plan Principal Problem:   Respiratory failure, acute-on-chronic Active Problems:   COPD exacerbation   DM (diabetes mellitus), type 2, uncontrolled   HTN (hypertension)   Hyperlipidemia   1. Respiratory failure, acute on chronic: Secondary to COPD exacerbation. Admitted to step down unit. Continue oxygen and treatment for COPD. Monitor closely. 2. Acute exacerbation of COPD: IV Solu-Medrol, bronchodilator nebulizations, oxygen, brief course of IV antibiotics-Rocephin and azithromycin will be discontinued. Although chest x-ray is read as pneumonia, patient has no fever or new onset cough. 3. Leukocytosis:  Probably stress margination from problem #1. Follow CBC in a.m. 4. Elevated troponin: Likely from demand ischemia from problem #1. Chest pain is atypical. Cycle cardiac enzymes and obtain 2-D echocardiogram. Full dose aspirin. If patient has persistent chest pain or if troponins continue to trend upwards or if echo is abnormal, consider further evaluation including cardiology consultation. 5. Mild transaminitis: Unclear etiology. Followup LFTs in a.m. 6. Uncontrolled DM 2: Will likely worsen secondary to steroids and stress situation. Low-dose Lantus and SSI. Hold metformin while  inpatient. 7. Hypertension: Currently mildly hypotensive, likely secondary to Lasix. Patient has no features of CHF. Brief IV fluids and monitor. On no medications at home. 8. Hyperlipidemia: Diet controlled.     Code Status: Full  Family Communication: Discussed with patient's daughter, Ms. Allen Norris at bedside.  Disposition Plan: Home when medically stable.   Time spent: 65 minutes  [facility] Pager [phone]  If 7PM-7AM, please contact night-coverage [email or web address] Password [email or web address] 06/21/2012, 9:06 AM

## 2012-06-21 NOTE — Progress Notes (Signed)
  Echocardiogram 2D Echocardiogram has been performed.  [name] A 06/21/2012, 3:57 PM

## 2012-06-22 ENCOUNTER — Encounter (HOSPITAL_COMMUNITY): Payer: Self-pay | Admitting: Interventional Cardiology

## 2012-06-22 DIAGNOSIS — I214 Non-ST elevation (NSTEMI) myocardial infarction: Secondary | ICD-10-CM | POA: Diagnosis present

## 2012-06-22 LAB — BASIC METABOLIC PANEL
CO2: 29 mEq/L (ref 19–32)
Calcium: 9 mg/dL (ref 8.4–10.5)
Creatinine, Ser: 0.96 mg/dL (ref 0.50–1.10)
GFR calc non Af Amer: 53 mL/min — ABNORMAL LOW (ref >90)

## 2012-06-22 LAB — GLUCOSE, CAPILLARY
Glucose-Capillary: 183 mg/dL — ABNORMAL HIGH (ref 70–99)
Glucose-Capillary: 177 mg/dL — ABNORMAL HIGH (ref 70–99)

## 2012-06-22 LAB — CBC
MCV: 85.4 fL (ref 78.0–100.0)
Platelets: 272 10*3/uL (ref 150–400)
RDW: 13.4 % (ref 11.5–15.5)
WBC: 13.1 10*3/uL — ABNORMAL HIGH (ref 4.0–10.5)

## 2012-06-22 LAB — HEPATIC FUNCTION PANEL
AST: 98 U/L — ABNORMAL HIGH (ref 0–37)
Bilirubin, Direct: <0.1 mg/dL (ref 0.0–0.3)

## 2012-06-22 MED ORDER — LEVALBUTEROL HCL 0.63 MG/3ML IN NEBU: 0.6300 mg | INHALATION_SOLUTION | Freq: Four times a day (QID) | RESPIRATORY_TRACT | Status: DC

## 2012-06-22 MED ORDER — LEVALBUTEROL HCL 0.63 MG/3ML IN NEBU: 0.6300 mg | INHALATION_SOLUTION | RESPIRATORY_TRACT | Status: DC | PRN

## 2012-06-22 MED ORDER — TEMAZEPAM 7.5 MG PO CAPS
7.5000 mg | ORAL_CAPSULE | Freq: Every evening | ORAL | Status: DC | PRN
  Administered 2012-06-22: 7.5 mg via ORAL
  Filled 2012-06-22: qty 1

## 2012-06-22 MED ORDER — MUPIROCIN 2 % EX OINT
1.0000 "application " | TOPICAL_OINTMENT | Freq: Two times a day (BID) | CUTANEOUS | Status: AC
  Administered 2012-06-22 – 2012-06-26 (×10): 1 via NASAL
  Filled 2012-06-22: qty 22

## 2012-06-22 MED ORDER — CHLORHEXIDINE GLUCONATE CLOTH 2 % EX PADS: 6.0000 | MEDICATED_PAD | Freq: Every day | CUTANEOUS | Status: DC

## 2012-06-22 MED ORDER — METHYLPREDNISOLONE SODIUM SUCC 125 MG IJ SOLR
60.0000 mg | Freq: Two times a day (BID) | INTRAMUSCULAR | Status: DC
  Administered 2012-06-22: 60 mg via INTRAVENOUS
  Filled 2012-06-22 (×3): qty 0.96

## 2012-06-22 MED ORDER — LORAZEPAM 2 MG/ML IJ SOLN
0.5000 mg | Freq: Four times a day (QID) | INTRAMUSCULAR | Status: DC | PRN
  Administered 2012-06-22: 1 mg via INTRAVENOUS

## 2012-06-22 MED ORDER — CHLORHEXIDINE GLUCONATE CLOTH 2 % EX PADS
6.0000 | MEDICATED_PAD | Freq: Every day | CUTANEOUS | Status: AC
  Administered 2012-06-22 – 2012-06-26 (×5): 6 via TOPICAL

## 2012-06-22 MED ORDER — LEVALBUTEROL HCL 0.63 MG/3ML IN NEBU
0.6300 mg | INHALATION_SOLUTION | RESPIRATORY_TRACT | Status: DC
  Administered 2012-06-22 – 2012-06-26 (×22): 0.63 mg via RESPIRATORY_TRACT
  Filled 2012-06-22 (×33): qty 3

## 2012-06-22 MED ORDER — IPRATROPIUM BROMIDE 0.02 % IN SOLN
0.5000 mg | RESPIRATORY_TRACT | Status: DC
  Administered 2012-06-22 – 2012-06-23 (×6): 0.5 mg via RESPIRATORY_TRACT
  Filled 2012-06-22 (×6): qty 2.5

## 2012-06-22 MED ORDER — LORAZEPAM 2 MG/ML IJ SOLN
INTRAMUSCULAR | Status: AC
  Filled 2012-06-22: qty 1

## 2012-06-22 MED ORDER — LEVALBUTEROL HCL 0.63 MG/3ML IN NEBU
0.6300 mg | INHALATION_SOLUTION | RESPIRATORY_TRACT | Status: DC | PRN
  Filled 2012-06-22 (×2): qty 3

## 2012-06-22 NOTE — Progress Notes (Signed)
CRITICAL VALUE ALERT   Critical value received:  CKMB 19.0  Date of notification:  06-21-2012  Time of notification:  1925  Critical value read back:yes  Nurse who received alert:  Maximino Greenland  MD notified (1st page):  Recurring problem, MD aware  Time of first page:    MD notified (2nd page):  Time of second page:  Responding MD:    Time MD responded:

## 2012-06-22 NOTE — Progress Notes (Signed)
Pt complained stating she "feels funny and that she is having a lot of pressure on her chest but no pain", pt also became diaphoretic and increase work of breathing. MD Schorr notified, instructed RN to notify cardiology. MD Eldridge Dace paged. No reply received yet. Will continue to assess.  [place], [place], Charity fundraiser

## 2012-06-22 NOTE — Progress Notes (Signed)
Pt developed uneasy feelings, increased HR to 130 and increased RR and WOB after albuterol/trovent treatment. RT changed Albuterol to Xopnex 0.63mg  Q6 and Q3 PRN per RT protocol.

## 2012-06-22 NOTE — Progress Notes (Signed)
Inpatient Diabetes Program Recommendations  AACE/ADA: New Consensus Statement on Inpatient Glycemic Control (2013)  Target Ranges:  Prepandial:   less than 140 mg/dL      Peak postprandial:   less than 180 mg/dL (1-2 hours)      Critically ill patients:  140 - 180 mg/dL    Results for [name], [name] (MRN [id number]) as of 06/22/2012 13:18  Ref. Range 06/22/2012 07:45 06/22/2012 12:13  Glucose-Capillary Latest Range: 70-99 mg/dL 782 (H) 956 (H)   Patient on IV steroids.  Noted Lantus 10 units daily started yesterday.  Fasting glucose levels remain elevated.  MD- Please consider increasing Lantus to 15 units daily at 10am while patient remains on IV steroids.   Note: Will follow. Ambrose Finland RN, MSN, CDE Diabetes Coordinator Inpatient Diabetes Program [phone]

## 2012-06-22 NOTE — Progress Notes (Signed)
Subjective: [name] is a very pleasant 79 year old patient of mine who has done remarkably well to have such severe COPD.  She has been oxygen dependent for years.  Breathing better than when she came in but still quite anxious.  Troponins have been borderline elevated but are trending down.  Quite anxious on albuterol and we have switched to Xopenex.  Will also try to reduce steroids to every 12 hours from every 6 hours.  Adding Ativan for anxiety  Objective: Weight change:   Intake/Output Summary (Last 24 hours) at 06/22/12 1408 Last data filed at 06/22/12 1220  Gross per 24 hour  Intake    480 ml  Output   1450 ml  Net   -970 ml   Filed Vitals:   06/22/12 0743 06/22/12 0822 06/22/12 1219 06/22/12 1304  BP: 125/52     Pulse: 93     Temp: 98 F (36.7 C)  97.8 F (36.6 C)   TempSrc: Oral  Axillary   Resp: 23     Height:      Weight:      SpO2: 99% 97%  96%    General exam: Moderately built and nourished female patient, lying comfortably supine on the gurney in mild respiratory distress.  Head, eyes and ENT:  Oral mucosa moist.  Neck: Supple. No JVD  Respiratory system: Reduced breath sounds bilaterally with bilateral scattered expiratory medium pitched rhonchi. Mild increased work of breathing. Able to speak in full sentences.  Cardiovascular system: S1 and S2 heard, regular, neg murmur Extremities: Symmetric 5 x 5 power. Peripheral pulses symmetrically felt.  Skin: No rashes or acute findings on exam of exposed skin.  Musculoskeletal system: Negative exam.  Psychiatry: Pleasant and cooperative, somewhat anxious  Lab Results: Results for orders placed during the hospital encounter of 06/21/12 (from the past 48 hour(s))  CBC WITH DIFFERENTIAL     Status: Abnormal   Collection Time    06/21/12  6:20 AM      Result Value Range   WBC 19.2 (*) 4.0 - 10.5 K/uL   RBC 4.31  3.87 - 5.11 MIL/uL   Hemoglobin 12.7  12.0 - 15.0 g/dL   HCT 16.1  09.6 - 04.5 %   MCV 87.0   78.0 - 100.0 fL   MCH 29.5  26.0 - 34.0 pg   MCHC 33.9  30.0 - 36.0 g/dL   RDW 40.9  81.1 - 91.4 %   Platelets 366  150 - 400 K/uL   Neutrophils Relative 74  43 - 77 %   Neutro Abs 14.1 (*) 1.7 - 7.7 K/uL   Lymphocytes Relative 19  12 - 46 %   Lymphs Abs 3.6  0.7 - 4.0 K/uL   Monocytes Relative 7  3 - 12 %   Monocytes Absolute 1.3 (*) 0.1 - 1.0 K/uL   Eosinophils Relative 1  0 - 5 %   Eosinophils Absolute 0.1  0.0 - 0.7 K/uL   Basophils Relative 0  0 - 1 %   Basophils Absolute 0.1  0.0 - 0.1 K/uL  COMPREHENSIVE METABOLIC PANEL     Status: Abnormal   Collection Time    06/21/12  6:20 AM      Result Value Range   Sodium 136  135 - 145 mEq/L   Potassium 4.1  3.5 - 5.1 mEq/L   Chloride 95 (*) 96 - 112 mEq/L   CO2 26  19 - 32 mEq/L   Glucose, Bld 194 (*) 70 -  99 mg/dL   BUN 30 (*) 6 - 23 mg/dL   Creatinine, Ser 0.45 (*) 0.50 - 1.10 mg/dL   Calcium 9.6  8.4 - 40.9 mg/dL   Total Protein 7.2  6.0 - 8.3 g/dL   Albumin 3.6  3.5 - 5.2 g/dL   AST 98 (*) 0 - 37 U/L   ALT 52 (*) 0 - 35 U/L   Alkaline Phosphatase 100  39 - 117 U/L   Total Bilirubin 0.8  0.3 - 1.2 mg/dL   GFR calc non Af Amer 43 (*) >90 mL/min   GFR calc Af Amer 50 (*) >90 mL/min   Comment:            The eGFR has been calculated     using the CKD EPI equation.     This calculation has not been     validated in all clinical     situations.     eGFR's persistently     <90 mL/min signify     possible Chronic Kidney Disease.  TROPONIN I     Status: Abnormal   Collection Time    06/21/12  6:21 AM      Result Value Range   Troponin I 0.40 (*) <0.30 ng/mL   Comment:            Due to the release kinetics of cTnI,     a negative result within the first hours     of the onset of symptoms does not rule out     myocardial infarction with certainty.     If myocardial infarction is still suspected,     repeat the test at appropriate intervals.     CRITICAL RESULT CALLED TO, READ BACK BY AND VERIFIED WITH:     Vena Rua RN  06/21/12 0723 COSTELLO B  PRO B NATRIURETIC PEPTIDE     Status: Abnormal   Collection Time    06/21/12  6:21 AM      Result Value Range   Pro B Natriuretic peptide (BNP) 1885.0 (*) 0 - 450 pg/mL  POCT I-STAT 3, BLOOD GAS (G3+)     Status: Abnormal   Collection Time    06/21/12  6:42 AM      Result Value Range   pH, Arterial 7.294 (*) 7.350 - 7.450   pCO2 arterial 56.5 (*) 35.0 - 45.0 mmHg   pO2, Arterial 128.0 (*) 80.0 - 100.0 mmHg   Bicarbonate 27.4 (*) 20.0 - 24.0 mEq/L   TCO2 29  0 - 100 mmol/L   O2 Saturation 98.0     Patient temperature 98.6 F     Collection site RADIAL, ALLEN'S TEST ACCEPTABLE     Drawn by RT     Sample type ARTERIAL    CG4 I-STAT (LACTIC ACID)     Status: None   Collection Time    06/21/12  8:34 AM      Result Value Range   Lactic Acid, Venous 2.05  0.5 - 2.2 mmol/L  MRSA PCR SCREENING     Status: Abnormal   Collection Time    06/21/12  9:22 AM      Result Value Range   MRSA by PCR POSITIVE (*) NEGATIVE   Comment:            The GeneXpert MRSA Assay (FDA     approved for NASAL specimens     only), is one component of a     comprehensive MRSA colonization  surveillance program. It is not     intended to diagnose MRSA     infection nor to guide or     monitor treatment for     MRSA infections.     RESULT CALLED TO, READ BACK BY AND VERIFIED WITH:     W. DAVIS RN 12:47 06/21/12 ([email or web address])  HEMOGLOBIN A1C     Status: Abnormal   Collection Time    06/21/12  9:43 AM      Result Value Range   Hemoglobin A1C 7.3 (*) <5.7 %   Comment: (NOTE)                                                                               According to the ADA Clinical Practice Recommendations for 2011, when     HbA1c is used as a screening test:      >=6.5%   Diagnostic of Diabetes Mellitus               (if abnormal result is confirmed)     5.7-6.4%   Increased risk of developing Diabetes Mellitus     References:Diagnosis and Classification of Diabetes Mellitus,Diabetes      Care,2011,34(Suppl 1):S62-S69 and Standards of Medical Care in             Diabetes - 2011,Diabetes Care,2011,34 (Suppl 1):S11-S61.   Mean Plasma Glucose 163 (*) <117 mg/dL  BLOOD GAS, ARTERIAL     Status: Abnormal   Collection Time    06/21/12 10:10 AM      Result Value Range   O2 Content 2.0     Delivery systems NASAL CANNULA     pH, Arterial 7.375  7.350 - 7.450   pCO2 arterial 38.6  35.0 - 45.0 mmHg   pO2, Arterial 72.2 (*) 80.0 - 100.0 mmHg   Bicarbonate 22.1  20.0 - 24.0 mEq/L   TCO2 23.2  0 - 100 mmol/L   Acid-base deficit 2.3 (*) 0.0 - 2.0 mmol/L   O2 Saturation 94.7     Patient temperature 98.6     Collection site LEFT RADIAL     Drawn by [id number]     Sample type ARTERIAL DRAW     Allens test (pass/fail) PASS  PASS  GLUCOSE, CAPILLARY     Status: Abnormal   Collection Time    06/21/12 10:34 AM      Result Value Range   Glucose-Capillary 265 (*) 70 - 99 mg/dL  GLUCOSE, CAPILLARY     Status: Abnormal   Collection Time    06/21/12 12:01 PM      Result Value Range   Glucose-Capillary 341 (*) 70 - 99 mg/dL   Comment 1 Documented in Chart     Comment 2 Notify RN    TROPONIN I     Status: Abnormal   Collection Time    06/21/12  1:30 PM      Result Value Range   Troponin I 0.74 (*) <0.30 ng/mL   Comment:            Due to the release kinetics of cTnI,     a negative result within the first hours     of the onset  of symptoms does not rule out     myocardial infarction with certainty.     If myocardial infarction is still suspected,     repeat the test at appropriate intervals.     CRITICAL RESULT CALLED TO, READ BACK BY AND VERIFIED WITH:     DAVIS, W. RN @ 1429 ON 06/21/12 BY SASSUD/COSTELLOB  GLUCOSE, CAPILLARY     Status: Abnormal   Collection Time    06/21/12  4:53 PM      Result Value Range   Glucose-Capillary 270 (*) 70 - 99 mg/dL  TROPONIN I     Status: Abnormal   Collection Time    06/21/12  5:54 PM      Result Value Range   Troponin I 0.74 (*) <0.30  ng/mL   Comment:            Due to the release kinetics of cTnI,     a negative result within the first hours     of the onset of symptoms does not rule out     myocardial infarction with certainty.     If myocardial infarction is still suspected,     repeat the test at appropriate intervals.     CRITICAL VALUE NOTED.  VALUE IS CONSISTENT WITH PREVIOUSLY REPORTED AND CALLED VALUE.  CK TOTAL AND CKMB     Status: Abnormal   Collection Time    06/21/12  5:54 PM      Result Value Range   Total CK 389 (*) 7 - 177 U/L   CK, MB 19.0 (*) 0.3 - 4.0 ng/mL   Comment: CRITICAL RESULT CALLED TO, READ BACK BY AND VERIFIED WITH:     RN P. MUSIAL 06/21/12 1907 KERAN M.   Relative Index 4.9 (*) 0.0 - 2.5  GLUCOSE, CAPILLARY     Status: Abnormal   Collection Time    06/21/12  9:28 PM      Result Value Range   Glucose-Capillary 232 (*) 70 - 99 mg/dL   Comment 1 Notify RN     Comment 2 Documented in Chart    TROPONIN I     Status: Abnormal   Collection Time    06/22/12 12:05 AM      Result Value Range   Troponin I 0.62 (*) <0.30 ng/mL   Comment:            Due to the release kinetics of cTnI,     a negative result within the first hours     of the onset of symptoms does not rule out     myocardial infarction with certainty.     If myocardial infarction is still suspected,     repeat the test at appropriate intervals.     CRITICAL VALUE NOTED.  VALUE IS CONSISTENT WITH PREVIOUSLY REPORTED AND CALLED VALUE.  BASIC METABOLIC PANEL     Status: Abnormal   Collection Time    06/22/12  4:38 AM      Result Value Range   Sodium 139  135 - 145 mEq/L   Potassium 3.3 (*) 3.5 - 5.1 mEq/L   Chloride 98  96 - 112 mEq/L   CO2 29  19 - 32 mEq/L   Glucose, Bld 207 (*) 70 - 99 mg/dL   BUN 34 (*) 6 - 23 mg/dL   Creatinine, Ser 5.62  0.50 - 1.10 mg/dL   Calcium 9.0  8.4 - 13.0 mg/dL   GFR calc non Af Amer 53 (*) >90  mL/min   GFR calc Af Amer 62 (*) >90 mL/min   Comment:            The eGFR has been  calculated     using the CKD EPI equation.     This calculation has not been     validated in all clinical     situations.     eGFR's persistently     <90 mL/min signify     possible Chronic Kidney Disease.  CBC     Status: Abnormal   Collection Time    06/22/12  4:38 AM      Result Value Range   WBC 13.1 (*) 4.0 - 10.5 K/uL   RBC 3.71 (*) 3.87 - 5.11 MIL/uL   Hemoglobin 11.0 (*) 12.0 - 15.0 g/dL   HCT 29.5 (*) 62.1 - 30.8 %   MCV 85.4  78.0 - 100.0 fL   MCH 29.6  26.0 - 34.0 pg   MCHC 34.7  30.0 - 36.0 g/dL   RDW 65.7  84.6 - 96.2 %   Platelets 272  150 - 400 K/uL   Comment: DELTA CHECK NOTED     SPECIMEN CHECKED FOR CLOTS     REPEATED TO VERIFY  HEPATIC FUNCTION PANEL     Status: Abnormal   Collection Time    06/22/12  4:38 AM      Result Value Range   Total Protein 6.5  6.0 - 8.3 g/dL   Albumin 3.0 (*) 3.5 - 5.2 g/dL   AST 98 (*) 0 - 37 U/L   ALT 58 (*) 0 - 35 U/L   Alkaline Phosphatase 85  39 - 117 U/L   Total Bilirubin 0.2 (*) 0.3 - 1.2 mg/dL   Bilirubin, Direct <9.5  0.0 - 0.3 mg/dL   Indirect Bilirubin NOT CALCULATED  0.3 - 0.9 mg/dL  GLUCOSE, CAPILLARY     Status: Abnormal   Collection Time    06/22/12  7:45 AM      Result Value Range   Glucose-Capillary 208 (*) 70 - 99 mg/dL   Comment 1 Documented in Chart     Comment 2 Notify RN    GLUCOSE, CAPILLARY     Status: Abnormal   Collection Time    06/22/12 12:13 PM      Result Value Range   Glucose-Capillary 215 (*) 70 - 99 mg/dL   Comment 1 Documented in Chart     Comment 2 Notify RN      Studies/Results: Dg Chest Port 1 View  06/21/2012  *RADIOLOGY REPORT*  Clinical Data: Shortness of breath and cough.  PORTABLE CHEST - 1 VIEW  Comparison: Chest radiograph performed 06/12/2010  Findings: The lungs are mildly hyperexpanded, with flattening of the hemidiaphragms, likely reflecting COPD.  Vascular congestion is noted, with peribronchial thickening.  Apparent mild bibasilar and right mid lung opacities may  reflect atelectasis or possibly mild pneumonia.  No definite pleural effusion or pneumothorax is seen, though the right costophrenic angle is incompletely imaged on this study.  The cardiomediastinal silhouette is normal in size; calcification is noted within the aortic arch.  No acute osseous abnormalities are seen.  IMPRESSION:  1.  Apparent mild bibasilar and right mid lung opacities may reflect atelectasis or possibly mild pneumonia. 2.  Findings suggest COPD.  Vascular congestion noted, with peribronchial thickening.   Original Report Authenticated By: Tonia Ghent, M.D.    Medications: Scheduled Meds: . aspirin EC  325 mg Oral Daily  . azithromycin  500 mg Intravenous Q24H  . cefTRIAXone (ROCEPHIN)  IV  1 g Intravenous Q24H  . Chlorhexidine Gluconate Cloth  6 each Topical Q0600  . enoxaparin (LOVENOX) injection  40 mg Subcutaneous Q24H  . guaiFENesin  600 mg Oral BID  . insulin aspart  0-5 Units Subcutaneous QHS  . insulin aspart  0-9 Units Subcutaneous TID WC  . insulin aspart  3 Units Subcutaneous TID WC  . insulin glargine  10 Units Subcutaneous Daily  . ipratropium  0.5 mg Nebulization Q4H  . levalbuterol  0.63 mg Nebulization Q4H WA  . [START ON 06/23/2012] methylPREDNISolone (SOLU-MEDROL) injection  60 mg Intravenous Q12H  . mupirocin ointment  1 application Nasal BID  . sodium chloride  3 mL Intravenous Q12H   Continuous Infusions:  PRN Meds:.acetaminophen, acetaminophen, guaiFENesin-dextromethorphan, levalbuterol, LORazepam, ondansetron (ZOFRAN) IV, ondansetron  Assessment/Plan:  Principal Problem:  Respiratory failure, acute-on-chronic  Active Problems:  COPD exacerbation  DM (diabetes mellitus), type 2, uncontrolled  HTN (hypertension)  Hyperlipidemia   1. Respiratory failure, acute on chronic: Secondary to COPD exacerbation. Continue in step down unit today and continue oxygen and treatment for COPD. Monitor closely. 2. Acute exacerbation of COPD: IV Solu-Medrolevery  12 hours, bronchodilator nebulizationswith Xopenex, oxygen, brief course of IV antibiotics - Rocephin and azithromycin - and we'll transition to oral antibiotics tomorrow. Although chest x-ray is read as pneumonia, patient has no fever or new onset cough. Repeat chest x-ray tomorrow 3. Leukocytosis: Probably stress margination from problem #1. Follow CBC in a.m. 4. Elevated troponin: Likely from demand ischemia from problem #1. Chest pain is atypical. Cycle cardiac enzymes and followup on 2-D echocardiogram. Full dose aspirin. If patient has persistent chest pain or if troponins continue to trend upwards or if echo is abnormal, consider further evaluation - appreciate cardiac consultation from Dr. Everette Rank 5. Mild transaminitis: Unclear etiology. Followup LFTs in a.m. 6. Uncontrolled DM 2: Will likely worsen secondary to steroids and stress situation. Low-dose Lantus and SSI. Continue to hold metformin while inpatient. 7. Hypertension: Currently mildly hypotensive, likely secondary to Lasix. Patient has no features of CHF. Brief IV fluids and monitor. On no medications at home. 8. Hyperlipidemia: Diet controlled. 9. Anxiety: likely from steroids.  We'll reduce him slightly and provide lorazepam as needed   LOS: 1 day   Pearla Dubonnet, MD 06/22/2012, 2:08 PM

## 2012-06-22 NOTE — Progress Notes (Signed)
SUBJECTIVE:  Still short of breath.  Improving.  No chest pain.  OBJECTIVE:   Vitals:   Filed Vitals:   06/22/12 0435 06/22/12 0743 06/22/12 0822 06/22/12 1219  BP: 123/61 125/52    Pulse: 95 93    Temp: 98.4 F (36.9 C) 98 F (36.7 C)  97.8 F (36.6 C)  TempSrc: Oral Oral  Axillary  Resp: 19 23    Height:      Weight:      SpO2: 98% 99% 97%    I&O's:   Intake/Output Summary (Last 24 hours) at 06/22/12 1248 Last data filed at 06/22/12 1220  Gross per 24 hour  Intake    720 ml  Output   1450 ml  Net   -730 ml   TELEMETRY: Reviewed telemetry pt in sinus tachycardia:     PHYSICAL EXAM General: Frail, elderly woman who appears short of breath Head:   Normal cephalic and atramatic  Lungs:   Bilateral wheezing Heart:  Tachycardic, S1 S2  Abdomen:  abdomen soft and non-tender Msk:  . Normal strength and tone for age. Extremities:   No  edema.  Neuro: Alert and oriented X 3. Psych:  Good affect, responds appropriately   LABS: Basic Metabolic Panel:  Recent Labs  16/10/96 0620 06/22/12 0438  NA 136 139  K 4.1 3.3*  CL 95* 98  CO2 26 29  GLUCOSE 194* 207*  BUN 30* 34*  CREATININE 1.14* 0.96  CALCIUM 9.6 9.0   Liver Function Tests:  Recent Labs  06/21/12 0620 06/22/12 0438  AST 98* 98*  ALT 52* 58*  ALKPHOS 100 85  BILITOT 0.8 0.2*  PROT 7.2 6.5  ALBUMIN 3.6 3.0*   No results found for this basename: LIPASE, AMYLASE,  in the last 72 hours CBC:  Recent Labs  06/21/12 0620 06/22/12 0438  WBC 19.2* 13.1*  NEUTROABS 14.1*  --   HGB 12.7 11.0*  HCT 37.5 31.7*  MCV 87.0 85.4  PLT 366 272   Cardiac Enzymes:  Recent Labs  06/21/12 1330 06/21/12 1754 06/22/12 0005  CKTOTAL  --  389*  --   CKMB  --  19.0*  --   TROPONINI 0.74* 0.74* 0.62*   BNP: No components found with this basename: POCBNP,  D-Dimer: No results found for this basename: DDIMER,  in the last 72 hours Hemoglobin A1C:  Recent Labs  06/21/12 0943  HGBA1C 7.3*    Fasting Lipid Panel: No results found for this basename: CHOL, HDL, LDLCALC, TRIG, CHOLHDL, LDLDIRECT,  in the last 72 hours Thyroid Function Tests: No results found for this basename: TSH, T4TOTAL, FREET3, T3FREE, THYROIDAB,  in the last 72 hours Anemia Panel: No results found for this basename: VITAMINB12, FOLATE, FERRITIN, TIBC, IRON, RETICCTPCT,  in the last 72 hours Coag Panel:   No results found for this basename: INR, PROTIME    RADIOLOGY: Dg Chest Port 1 View  06/21/2012  *RADIOLOGY REPORT*  Clinical Data: Shortness of breath and cough.  PORTABLE CHEST - 1 VIEW  Comparison: Chest radiograph performed 06/12/2010  Findings: The lungs are mildly hyperexpanded, with flattening of the hemidiaphragms, likely reflecting COPD.  Vascular congestion is noted, with peribronchial thickening.  Apparent mild bibasilar and right mid lung opacities may reflect atelectasis or possibly mild pneumonia.  No definite pleural effusion or pneumothorax is seen, though the right costophrenic angle is incompletely imaged on this study.  The cardiomediastinal silhouette is normal in size; calcification is noted within the aortic arch.  No acute osseous abnormalities are seen.  IMPRESSION:  1.  Apparent mild bibasilar and right mid lung opacities may reflect atelectasis or possibly mild pneumonia. 2.  Findings suggest COPD.  Vascular congestion noted, with peribronchial thickening.   Original Report Authenticated By: Tonia Ghent, M.D.       ASSESSMENT: Non-STEMI in the setting of COPD exacerbation, hypoxia.  PLAN:  She has not had any symptoms of ischemia.  She has mildly decreased left ventricular function.  Would try to avoid excessive IV fluid administration.  She has severe lung disease.  Would not use beta blocker.  She is not very active physically even when she is at her baseline.  I would not plan for any invasive cardiac testing.  She has no angina at this time.  Troponin is decreasing.  Continue medical  therapy.  Corky Crafts., MD  06/22/2012  12:48 PM

## 2012-06-22 NOTE — Progress Notes (Signed)
Utilization Review Completed.   Kimberly Tucker, RN, BSN Nurse Case Manager  [phone]  

## 2012-06-23 ENCOUNTER — Inpatient Hospital Stay (HOSPITAL_COMMUNITY): Payer: Medicare Other

## 2012-06-23 DIAGNOSIS — R931 Abnormal findings on diagnostic imaging of heart and coronary circulation: Secondary | ICD-10-CM | POA: Diagnosis present

## 2012-06-23 DIAGNOSIS — G2581 Restless legs syndrome: Secondary | ICD-10-CM | POA: Diagnosis not present

## 2012-06-23 DIAGNOSIS — F19982 Other psychoactive substance use, unspecified with psychoactive substance-induced sleep disorder: Secondary | ICD-10-CM | POA: Diagnosis not present

## 2012-06-23 DIAGNOSIS — K1379 Other lesions of oral mucosa: Secondary | ICD-10-CM | POA: Diagnosis not present

## 2012-06-23 DIAGNOSIS — F411 Generalized anxiety disorder: Secondary | ICD-10-CM | POA: Diagnosis not present

## 2012-06-23 LAB — GLUCOSE, CAPILLARY
Glucose-Capillary: 282 mg/dL — ABNORMAL HIGH (ref 70–99)
Glucose-Capillary: 188 mg/dL — ABNORMAL HIGH (ref 70–99)

## 2012-06-23 LAB — CK TOTAL AND CKMB (NOT AT ARMC): Relative Index: 9.7 — ABNORMAL HIGH (ref 0.0–2.5)

## 2012-06-23 MED ORDER — PRAMIPEXOLE DIHYDROCHLORIDE 0.125 MG PO TABS
0.1250 mg | ORAL_TABLET | Freq: Three times a day (TID) | ORAL | Status: DC
  Administered 2012-06-23 – 2012-06-28 (×16): 0.125 mg via ORAL
  Filled 2012-06-23 (×20): qty 1

## 2012-06-23 MED ORDER — FUROSEMIDE 10 MG/ML IJ SOLN
INTRAMUSCULAR | Status: AC
  Administered 2012-06-23: 20 mg via INTRAVENOUS
  Filled 2012-06-23: qty 4

## 2012-06-23 MED ORDER — MAGIC MOUTHWASH
5.0000 mL | Freq: Four times a day (QID) | ORAL | Status: DC
  Administered 2012-06-23 – 2012-06-28 (×15): 5 mL via ORAL
  Filled 2012-06-23 (×21): qty 5

## 2012-06-23 MED ORDER — LOSARTAN POTASSIUM 50 MG PO TABS
50.0000 mg | ORAL_TABLET | Freq: Every day | ORAL | Status: DC
  Administered 2012-06-24 – 2012-06-28 (×5): 50 mg via ORAL
  Filled 2012-06-23 (×5): qty 1

## 2012-06-23 MED ORDER — ALPRAZOLAM 0.25 MG PO TABS: 0.2500 mg | ORAL_TABLET | Freq: Three times a day (TID) | ORAL | Status: DC | PRN

## 2012-06-23 MED ORDER — PREDNISONE 50 MG PO TABS
60.0000 mg | ORAL_TABLET | Freq: Every day | ORAL | Status: DC
  Administered 2012-06-23 – 2012-06-26 (×4): 60 mg via ORAL
  Filled 2012-06-23 (×5): qty 1

## 2012-06-23 MED ORDER — SIMVASTATIN 20 MG PO TABS
20.0000 mg | ORAL_TABLET | Freq: Every evening | ORAL | Status: DC
  Administered 2012-06-23 – 2012-06-27 (×5): 20 mg via ORAL
  Filled 2012-06-23 (×6): qty 1

## 2012-06-23 MED ORDER — FUROSEMIDE 10 MG/ML IJ SOLN: 20.0000 mg | Freq: Once | INTRAMUSCULAR | Status: AC

## 2012-06-23 MED ORDER — IPRATROPIUM BROMIDE 0.02 % IN SOLN
0.5000 mg | Freq: Four times a day (QID) | RESPIRATORY_TRACT | Status: DC
  Administered 2012-06-23 – 2012-06-26 (×13): 0.5 mg via RESPIRATORY_TRACT
  Filled 2012-06-23 (×10): qty 2.5

## 2012-06-23 MED ORDER — TEMAZEPAM 15 MG PO CAPS
15.0000 mg | ORAL_CAPSULE | Freq: Every evening | ORAL | Status: DC | PRN
  Administered 2012-06-23 – 2012-06-27 (×5): 15 mg via ORAL
  Filled 2012-06-23 (×5): qty 1

## 2012-06-23 MED ORDER — AZITHROMYCIN 500 MG PO TABS
500.0000 mg | ORAL_TABLET | Freq: Every day | ORAL | Status: DC
  Administered 2012-06-24 – 2012-06-28 (×5): 500 mg via ORAL
  Filled 2012-06-23 (×5): qty 1

## 2012-06-23 NOTE — Progress Notes (Signed)
Called report pt going to [phone] via w/c with belongings and family. [name]

## 2012-06-23 NOTE — Progress Notes (Signed)
Subjective: [name] respiratory-wise is slowly doing better. Oxygen saturation is good on 2 L. However she is restless, complaining of restless legs and needing to sit on the side of the bed a lot. Mouth is sore. Feels anxious. Receiving steroids and beta stimulants and this is likely contributing to her symptoms. Restless legs sounds like it may be a chronic symptom by her history today. Did not sleep that well last night with Restoril 7.5 mg. Not coughing up a lot of phlegm.  Objective: Weight change: 0.7 kg (1 lb 8.7 oz)  Intake/Output Summary (Last 24 hours) at 06/23/12 1011 Last data filed at 06/23/12 1610  Gross per 24 hour  Intake    340 ml  Output    950 ml  Net   -610 ml   Filed Vitals:   06/22/12 2358 06/23/12 0340 06/23/12 0752 06/23/12 1000  BP: 146/73 145/62 161/77   Pulse: 109 103 103   Temp: 97.7 F (36.5 C) 97.4 F (36.3 C)    TempSrc: Oral Oral Oral   Resp: 19 24 20    Height:      Weight:  75 kg (165 lb 5.5 oz)    SpO2: 98% 100% 93% 97%    General Appearance: Alert, cooperative,appears anxious and uncomfortable generally, appears stated age Lungs: diminished breath sounds throughout with faint end expiratory wheeze, minimal Heart: Regular rate and rhythm, S1 and S2 normal, no murmur, rub or gallop Abdomen: Soft, non-tender, bowel sounds active all four quadrants, no masses, no organomegaly Extremities: Extremities normal, atraumatic, no cyanosis or edema Neuro: oriented x 3, nonfocal  Lab Results: Results for orders placed during the hospital encounter of 06/21/12 (from the past 48 hour(s))  GLUCOSE, CAPILLARY     Status: Abnormal   Collection Time    06/21/12 10:34 AM      Result Value Range   Glucose-Capillary 265 (*) 70 - 99 mg/dL  GLUCOSE, CAPILLARY     Status: Abnormal   Collection Time    06/21/12 12:01 PM      Result Value Range   Glucose-Capillary 341 (*) 70 - 99 mg/dL   Comment 1 Documented in Chart     Comment 2 Notify RN    TROPONIN I      Status: Abnormal   Collection Time    06/21/12  1:30 PM      Result Value Range   Troponin I 0.74 (*) <0.30 ng/mL   Comment:            Due to the release kinetics of cTnI,     a negative result within the first hours     of the onset of symptoms does not rule out     myocardial infarction with certainty.     If myocardial infarction is still suspected,     repeat the test at appropriate intervals.     CRITICAL RESULT CALLED TO, READ BACK BY AND VERIFIED WITH:     DAVIS, W. RN @ 1429 ON 06/21/12 BY SASSUD/COSTELLOB  GLUCOSE, CAPILLARY     Status: Abnormal   Collection Time    06/21/12  4:53 PM      Result Value Range   Glucose-Capillary 270 (*) 70 - 99 mg/dL  TROPONIN I     Status: Abnormal   Collection Time    06/21/12  5:54 PM      Result Value Range   Troponin I 0.74 (*) <0.30 ng/mL   Comment:  Due to the release kinetics of cTnI,     a negative result within the first hours     of the onset of symptoms does not rule out     myocardial infarction with certainty.     If myocardial infarction is still suspected,     repeat the test at appropriate intervals.     CRITICAL VALUE NOTED.  VALUE IS CONSISTENT WITH PREVIOUSLY REPORTED AND CALLED VALUE.  CK TOTAL AND CKMB     Status: Abnormal   Collection Time    06/21/12  5:54 PM      Result Value Range   Total CK 389 (*) 7 - 177 U/L   CK, MB 19.0 (*) 0.3 - 4.0 ng/mL   Comment: CRITICAL RESULT CALLED TO, READ BACK BY AND VERIFIED WITH:     RN P. MUSIAL 06/21/12 1907 KERAN M.   Relative Index 4.9 (*) 0.0 - 2.5  GLUCOSE, CAPILLARY     Status: Abnormal   Collection Time    06/21/12  9:28 PM      Result Value Range   Glucose-Capillary 232 (*) 70 - 99 mg/dL   Comment 1 Notify RN     Comment 2 Documented in Chart    TROPONIN I     Status: Abnormal   Collection Time    06/22/12 12:05 AM      Result Value Range   Troponin I 0.62 (*) <0.30 ng/mL   Comment:            Due to the release kinetics of cTnI,     a negative  result within the first hours     of the onset of symptoms does not rule out     myocardial infarction with certainty.     If myocardial infarction is still suspected,     repeat the test at appropriate intervals.     CRITICAL VALUE NOTED.  VALUE IS CONSISTENT WITH PREVIOUSLY REPORTED AND CALLED VALUE.  BASIC METABOLIC PANEL     Status: Abnormal   Collection Time    06/22/12  4:38 AM      Result Value Range   Sodium 139  135 - 145 mEq/L   Potassium 3.3 (*) 3.5 - 5.1 mEq/L   Chloride 98  96 - 112 mEq/L   CO2 29  19 - 32 mEq/L   Glucose, Bld 207 (*) 70 - 99 mg/dL   BUN 34 (*) 6 - 23 mg/dL   Creatinine, Ser 1.61  0.50 - 1.10 mg/dL   Calcium 9.0  8.4 - 09.6 mg/dL   GFR calc non Af Amer 53 (*) >90 mL/min   GFR calc Af Amer 62 (*) >90 mL/min   Comment:            The eGFR has been calculated     using the CKD EPI equation.     This calculation has not been     validated in all clinical     situations.     eGFR's persistently     <90 mL/min signify     possible Chronic Kidney Disease.  CBC     Status: Abnormal   Collection Time    06/22/12  4:38 AM      Result Value Range   WBC 13.1 (*) 4.0 - 10.5 K/uL   RBC 3.71 (*) 3.87 - 5.11 MIL/uL   Hemoglobin 11.0 (*) 12.0 - 15.0 g/dL   HCT 04.5 (*) 40.9 - 81.1 %   MCV  85.4  78.0 - 100.0 fL   MCH 29.6  26.0 - 34.0 pg   MCHC 34.7  30.0 - 36.0 g/dL   RDW 40.9  81.1 - 91.4 %   Platelets 272  150 - 400 K/uL   Comment: DELTA CHECK NOTED     SPECIMEN CHECKED FOR CLOTS     REPEATED TO VERIFY  HEPATIC FUNCTION PANEL     Status: Abnormal   Collection Time    06/22/12  4:38 AM      Result Value Range   Total Protein 6.5  6.0 - 8.3 g/dL   Albumin 3.0 (*) 3.5 - 5.2 g/dL   AST 98 (*) 0 - 37 U/L   ALT 58 (*) 0 - 35 U/L   Alkaline Phosphatase 85  39 - 117 U/L   Total Bilirubin 0.2 (*) 0.3 - 1.2 mg/dL   Bilirubin, Direct <7.8  0.0 - 0.3 mg/dL   Indirect Bilirubin NOT CALCULATED  0.3 - 0.9 mg/dL  GLUCOSE, CAPILLARY     Status: Abnormal    Collection Time    06/22/12  7:45 AM      Result Value Range   Glucose-Capillary 208 (*) 70 - 99 mg/dL   Comment 1 Documented in Chart     Comment 2 Notify RN    GLUCOSE, CAPILLARY     Status: Abnormal   Collection Time    06/22/12 12:13 PM      Result Value Range   Glucose-Capillary 215 (*) 70 - 99 mg/dL   Comment 1 Documented in Chart     Comment 2 Notify RN    GLUCOSE, CAPILLARY     Status: Abnormal   Collection Time    06/22/12  4:25 PM      Result Value Range   Glucose-Capillary 183 (*) 70 - 99 mg/dL   Comment 1 Notify RN     Comment 2 Documented in Chart    GLUCOSE, CAPILLARY     Status: Abnormal   Collection Time    06/22/12  9:26 PM      Result Value Range   Glucose-Capillary 177 (*) 70 - 99 mg/dL   Comment 1 Notify RN     Comment 2 Documented in Chart    GLUCOSE, CAPILLARY     Status: Abnormal   Collection Time    06/23/12  1:15 AM      Result Value Range   Glucose-Capillary 102 (*) 70 - 99 mg/dL   Comment 1 Notify RN     Comment 2 Documented in Chart    GLUCOSE, CAPILLARY     Status: Abnormal   Collection Time    06/23/12  7:50 AM      Result Value Range   Glucose-Capillary 202 (*) 70 - 99 mg/dL   Comment 1 Documented in Chart     Comment 2 Notify RN      Studies/Results: No results found. Medications: Scheduled Meds: . aspirin EC  325 mg Oral Daily  . azithromycin  500 mg Intravenous Q24H  . cefTRIAXone (ROCEPHIN)  IV  1 g Intravenous Q24H  . Chlorhexidine Gluconate Cloth  6 each Topical Q0600  . enoxaparin (LOVENOX) injection  40 mg Subcutaneous Q24H  . guaiFENesin  600 mg Oral BID  . insulin aspart  0-5 Units Subcutaneous QHS  . insulin aspart  0-9 Units Subcutaneous TID WC  . insulin aspart  3 Units Subcutaneous TID WC  . insulin glargine  10 Units Subcutaneous Daily  . ipratropium  0.5 mg Nebulization Q4H  . levalbuterol  0.63 mg Nebulization Q4H WA  . mupirocin ointment  1 application Nasal BID  . pramipexole  0.125 mg Oral TID  .  predniSONE  60 mg Oral Q breakfast  . sodium chloride  3 mL Intravenous Q12H  . temazepam  15 mg Oral QHS,MR X 1   Continuous Infusions:  PRN Meds:.acetaminophen, acetaminophen, ALPRAZolam, guaiFENesin-dextromethorphan, levalbuterol, ondansetron (ZOFRAN) IV, ondansetron  Assessment/Plan: Principal Problem:   Respiratory failure, acute-on-chronic - lungs are clearer. Switch over to oral prednisone and continued antibiotics and Mucinex and bronchodilators.  Active Problems:   COPD exacerbation - improving slowly   DM (diabetes mellitus), type 2, uncontrolled - continue current therapy off metformin - try to resume prior to discharge   HTN (hypertension) - will not restart lisinopril but start losartan with less likelihood for cough   Hyperlipidemia - resume simvastatin low dose - follow LFT   Leukocytosis, unspecified - check CBC in a.m.   Elevated troponin - trending down as per cardiology   Abnormal echocardiogram with anteroseptal hypokinesis - repeat cardiac                    enzymes to rule out possibility ofacute coronary syndrome   Mild transaminitis - repeat LFT, question etiology, possibly hepatic congestion                                  or from a medication   Restless legs syndrome - try Mirapex   Mouth pain - try Magic mouthwash   Insomnia due to drug - try Restoril 15 mg h.s. P.r.n.   Anxiety state, unspecified - try alprazolam q.6 hours p.r.n. And reduce steroids  Disposition: Transfer to telemetry bed    LOS: 2 days   Pearla Dubonnet, MD 06/23/2012, 10:11 AM

## 2012-06-23 NOTE — Progress Notes (Signed)
Inpatient Diabetes Program Recommendations  AACE/ADA: New Consensus Statement on Inpatient Glycemic Control (2013)  Target Ranges:  Prepandial:   less than 140 mg/dL      Peak postprandial:   less than 180 mg/dL (1-2 hours)      Critically ill patients:  140 - 180 mg/dL    Results for TUANA, HOHEISEL (MRN [id number]) as of 06/23/2012 14:00  Ref. Range 06/23/2012 07:50 06/23/2012 11:44  Glucose-Capillary Latest Range: 70-99 mg/dL 528 (H) 413 (H)   Noted IV steroids stopped and Prednisone started.  Please increase Lantus to 15 units daily at 10 AM if patient continues to have elevated glucose levels.  Will follow. Ambrose Finland RN, MSN, CDE Diabetes Coordinator Inpatient Diabetes Program [phone]

## 2012-06-23 NOTE — Progress Notes (Signed)
Pt transferred to unit 4700 from 3300. Pt is alert and oriented to staff, call bell, and room. Bed in lowest position. Call bell within reach. Family at bedside. Full assessment to Epic. Will continue to monitor. Fayne Norrie, RN

## 2012-06-24 LAB — CBC WITH DIFFERENTIAL/PLATELET
Basophils Absolute: 0.1 10*3/uL (ref 0.0–0.1)
Lymphs Abs: 1 10*3/uL (ref 0.7–4.0)
MCV: 89.3 fL (ref 78.0–100.0)
Monocytes Relative: 10 % (ref 3–12)
Neutrophils Relative %: 82 % — ABNORMAL HIGH (ref 43–77)
Platelets: 346 10*3/uL (ref 150–400)
RDW: 13.6 % (ref 11.5–15.5)
WBC: 14.6 10*3/uL — ABNORMAL HIGH (ref 4.0–10.5)

## 2012-06-24 LAB — BASIC METABOLIC PANEL
CO2: 35 mEq/L — ABNORMAL HIGH (ref 19–32)
Calcium: 10 mg/dL (ref 8.4–10.5)
Creatinine, Ser: 0.89 mg/dL (ref 0.50–1.10)
GFR calc Af Amer: 68 mL/min — ABNORMAL LOW (ref >90)
GFR calc non Af Amer: 58 mL/min — ABNORMAL LOW (ref >90)
Sodium: 141 mEq/L (ref 135–145)

## 2012-06-24 LAB — GLUCOSE, CAPILLARY
Glucose-Capillary: 265 mg/dL — ABNORMAL HIGH (ref 70–99)
Glucose-Capillary: 157 mg/dL — ABNORMAL HIGH (ref 70–99)
Glucose-Capillary: 174 mg/dL — ABNORMAL HIGH (ref 70–99)

## 2012-06-24 LAB — HEPATIC FUNCTION PANEL
Albumin: 3.1 g/dL — ABNORMAL LOW (ref 3.5–5.2)
Bilirubin, Direct: <0.1 mg/dL (ref 0.0–0.3)
Total Bilirubin: 0.2 mg/dL — ABNORMAL LOW (ref 0.3–1.2)

## 2012-06-24 MED ORDER — POLYETHYLENE GLYCOL 3350 17 G PO PACK
17.0000 g | PACK | Freq: Two times a day (BID) | ORAL | Status: DC
  Administered 2012-06-24 – 2012-06-28 (×5): 17 g via ORAL
  Filled 2012-06-24 (×10): qty 1

## 2012-06-24 MED ORDER — INSULIN GLARGINE 100 UNIT/ML ~~LOC~~ SOLN
15.0000 [IU] | Freq: Every day | SUBCUTANEOUS | Status: DC
  Administered 2012-06-25 – 2012-06-28 (×4): 15 [IU] via SUBCUTANEOUS

## 2012-06-24 MED ORDER — POLYETHYLENE GLYCOL 3350 17 G PO PACK: 17.0000 g | PACK | Freq: Every day | ORAL | Status: DC

## 2012-06-24 NOTE — Progress Notes (Signed)
Subjective:  Feeling a little better from breathing perspective. Still with wheeze. Family member in room, discussed NSTEMI.   Objective:  Vital Signs in the last 24 hours: Temp:  [96.4 F (35.8 C)-97.7 F (36.5 C)] 96.4 F (35.8 C) (03/08 0500) Pulse Rate:  [102-105] 102 (03/08 0500) Resp:  [19-22] 22 (03/08 0500) BP: (133-159)/(66-78) 159/78 mmHg (03/08 0500) SpO2:  [95 %-100 %] 98 % (03/08 0810) Weight:  [76 kg (167 lb 8.8 oz)-76.1 kg (167 lb 12.3 oz)] 76 kg (167 lb 8.8 oz) (03/08 0500)  Intake/Output from previous day: 03/07 0701 - 03/08 0700 In: 940 [P.O.:840; IV Piggyback:100] Out: 900 [Urine:900]   Physical Exam: General: Frail, elderly woman who appears short of breath  Head: Normal cephalic and atramatic  Lungs: Bilateral wheezing  Heart: RRR, S1 S2  Abdomen: abdomen soft and non-tender  Msk: . Normal strength and tone for age.  Extremities: No edema.  Neuro: Alert and oriented X 3.  Psych: Good affect, responds appropriately     Lab Results:  Recent Labs  06/22/12 0438 06/24/12 0600  WBC 13.1* 14.6*  HGB 11.0* 12.7  PLT 272 346    Recent Labs  06/22/12 0438 06/24/12 0600  NA 139 141  K 3.3* 3.5  CL 98 97  CO2 29 35*  GLUCOSE 207* 175*  BUN 34* 40*  CREATININE 0.96 0.89    Recent Labs  06/22/12 0005 06/23/12 1140  TROPONINI 0.62* 0.53*   Hepatic Function Panel  Recent Labs  06/24/12 0600  PROT 6.9  ALBUMIN 3.1*  AST 81*  ALT 68*  ALKPHOS 111  BILITOT 0.2*  BILIDIR <0.1  IBILI NOT CALCULATED  Imaging: Dg Chest Port 1 View  06/23/2012  *RADIOLOGY REPORT*  Clinical Data: Patient.  Possible pneumonia.  PORTABLE CHEST - 1 VIEW  Comparison: Chest x-ray 06/21/2012.  Findings: Lung volumes are normal.  Prominent soft tissue in the right hilar region.  Interstitial prominence and patchy ill-defined airspace opacities throughout the right mid to lower lung, concerning for possible right lower lobe bronchopneumonia.  There may also be  involvement of the medial segment of the right middle lobe, as evidenced by partial obscuration of the right heart border.  Left lung appears relatively clear.  No definite pleural effusions.  No evidence of pulmonary edema.  Heart size is normal. The patient is rotated to the right on today's exam, resulting in distortion of the mediastinal contours and reduced diagnostic sensitivity and specificity for mediastinal pathology. Atherosclerosis of the thoracic aorta.  High density structures seen projecting over the mediastinum are favored to be exterior to the patient, as these are new compared to prior studies.  IMPRESSION: 1.  Findings, as above, concerning for right lower lobe, and possible right middle lobe bronchopneumonia.  Given the soft tissue prominence in the right hilar region, follow-up radiographs are recommended until complete resolution of these findings has been observed (i.e., to exclude a centrally obstructing neoplasm). Alternatively, if there is clinical suspicion for malignancy, further evaluation with contrast enhanced chest CT could be performed at this time. 2.  Atherosclerosis.   Original Report Authenticated By: Trudie Reed, M.D.    Personally viewed.   Telemetry: NSR, occasional PAC Personally viewed.   Cardiac Studies:  EF 40-45%  Assessment/Plan:  Principal Problem:   Respiratory failure, acute-on-chronic Active Problems:   COPD exacerbation   DM (diabetes mellitus), type 2, uncontrolled   HTN (hypertension)   Hyperlipidemia   Leukocytosis, unspecified   Elevated troponin   Restless  legs syndrome   Mouth pain   Insomnia due to drug   Anxiety state, unspecified   Abnormal echocardiogram    79 year old with COPD exacerbation with type 2 NSTEMI, EF 40-45%, HTN, DM.   -Reviewed Dr. Eldridge Dace note. Agree with medical mgt. On ASA, cozaar, simvastatin. No Bb because of severe COPD.  -Troponin mildly elevated, Type 2 NSTEMI.   -EF 40-45%. On ARB. Avoid excessive  fluids. No JVD now. Monitor.   -COPD main issue.   Will follow.  SKAINS, MARK 06/24/2012, 9:49 AM

## 2012-06-24 NOTE — Progress Notes (Signed)
Subjective: Feels a little better  Objective: Vital signs in last 24 hours: Temp:  [96.4 F (35.8 C)-97.7 F (36.5 C)] 96.4 F (35.8 C) (03/08 0500) Pulse Rate:  [102-107] 107 (03/08 1133) Resp:  [19-22] 22 (03/08 0500) BP: (133-159)/(66-78) 159/77 mmHg (03/08 1133) SpO2:  [95 %-100 %] 97 % (03/08 1133) Weight:  [76 kg (167 lb 8.8 oz)-76.1 kg (167 lb 12.3 oz)] 76 kg (167 lb 8.8 oz) (03/08 0500) Weight change: 1.1 kg (2 lb 6.8 oz) Last BM Date: 06/16/12  Intake/Output from previous day: 03/07 0701 - 03/08 0700 In: 940 [P.O.:840; IV Piggyback:100] Out: 900 [Urine:900] Intake/Output this shift: Total I/O In: 120 [P.O.:120] Out: 100 [Urine:100]  General appearance: alert and cooperative Resp: wheezes bilaterally Cardio: regular rate and rhythm, S1, S2 normal, no murmur, click, rub or gallop GI: soft, non-tender; bowel sounds normal; no masses,  no organomegaly Extremities: extremities normal, atraumatic, no cyanosis or edema  Lab Results:  Recent Labs  06/22/12 0438 06/24/12 0600  WBC 13.1* 14.6*  HGB 11.0* 12.7  HCT 31.7* 39.1  PLT 272 346   BMET  Recent Labs  06/22/12 0438 06/24/12 0600  NA 139 141  K 3.3* 3.5  CL 98 97  CO2 29 35*  GLUCOSE 207* 175*  BUN 34* 40*  CREATININE 0.96 0.89  CALCIUM 9.0 10.0    Studies/Results: Dg Chest Port 1 View  06/23/2012  *RADIOLOGY REPORT*  Clinical Data: Patient.  Possible pneumonia.  PORTABLE CHEST - 1 VIEW  Comparison: Chest x-ray 06/21/2012.  Findings: Lung volumes are normal.  Prominent soft tissue in the right hilar region.  Interstitial prominence and patchy ill-defined airspace opacities throughout the right mid to lower lung, concerning for possible right lower lobe bronchopneumonia.  There may also be involvement of the medial segment of the right middle lobe, as evidenced by partial obscuration of the right heart border.  Left lung appears relatively clear.  No definite pleural effusions.  No evidence of pulmonary  edema.  Heart size is normal. The patient is rotated to the right on today's exam, resulting in distortion of the mediastinal contours and reduced diagnostic sensitivity and specificity for mediastinal pathology. Atherosclerosis of the thoracic aorta.  High density structures seen projecting over the mediastinum are favored to be exterior to the patient, as these are new compared to prior studies.  IMPRESSION: 1.  Findings, as above, concerning for right lower lobe, and possible right middle lobe bronchopneumonia.  Given the soft tissue prominence in the right hilar region, follow-up radiographs are recommended until complete resolution of these findings has been observed (i.e., to exclude a centrally obstructing neoplasm). Alternatively, if there is clinical suspicion for malignancy, further evaluation with contrast enhanced chest CT could be performed at this time. 2.  Atherosclerosis.   Original Report Authenticated By: Trudie Reed, M.D.     Medications: I have reviewed the patient's current medications.  Assessment/Plan: Principal Problem:  Respiratory failure, acute-on-chronic - lungs still wheezing.Continue oral prednisone and continued IV antibiotics and Mucinex and bronchodilators.  Active Problems:  COPD exacerbation/possible R lung pneumonia - improving slowly, medical therapy as above DM (diabetes mellitus), type 2, uncontrolled - continue SSI, increase lantus dose  HTN (hypertension) -on losartan with less likelihood for cough  Leukocytosis, stable.  Elevated troponin - trending down as per cardiology  Mild transaminitis -slight improvement, question etiology, possibly hepatic congestion or from a medication  Restless legs syndrome -mirapex helping  Mouth pain -  Magic mouthwash  Insomnia due to  drug -  Restoril 15 mg h.s. P.r.n.  Anxiety state, unspecified -  alprazolam q.6 hours p.r.n. Steroids reduced PT/OT consult    LOS: 3 days   GRIFFIN,JOHN JOSEPH 06/24/2012, 11:38  AM

## 2012-06-25 MED ORDER — CEFUROXIME AXETIL 500 MG PO TABS
500.0000 mg | ORAL_TABLET | Freq: Two times a day (BID) | ORAL | Status: DC
  Administered 2012-06-25 – 2012-06-28 (×6): 500 mg via ORAL
  Filled 2012-06-25 (×8): qty 1

## 2012-06-25 NOTE — Progress Notes (Signed)
Subjective:  Feels a little bit better. No chest pain. Walked with physical therapy briefly. She struggled with stiffness.  Objective:  Vital Signs in the last 24 hours: Temp:  [97.5 F (36.4 C)-97.8 F (36.6 C)] 97.8 F (36.6 C) (03/09 0609) Pulse Rate:  [96-107] 96 (03/09 0609) Resp:  [21-22] 22 (03/09 0609) BP: (127-154)/(77-79) 154/78 mmHg (03/09 0609) SpO2:  [98 %-99 %] 99 % (03/09 0902) Weight:  [76.4 kg (168 lb 6.9 oz)] 76.4 kg (168 lb 6.9 oz) (03/09 0609)  Intake/Output from previous day: 03/08 0701 - 03/09 0700 In: 478 [P.O.:478] Out: 500 [Urine:500]   Physical Exam: General: Frail, elderly woman who appears short of breath  Head: Normal cephalic and atramatic  Lungs: Bilateral minor wheezing, upper airway gurgling Heart: RRR, S1 S2  Abdomen: abdomen soft and non-tender  Msk: . Normal strength and tone for age.  Extremities: No edema.  Neuro: Alert and oriented X 3.  Psych: Good affect, responds appropriately     Lab Results:  Recent Labs  06/24/12 0600  WBC 14.6*  HGB 12.7  PLT 346    Recent Labs  06/24/12 0600  NA 141  K 3.5  CL 97  CO2 35*  GLUCOSE 175*  BUN 40*  CREATININE 0.89    Recent Labs  06/23/12 1140  TROPONINI 0.53*   Hepatic Function Panel  Recent Labs  06/24/12 0600  PROT 6.9  ALBUMIN 3.1*  AST 81*  ALT 68*  ALKPHOS 111  BILITOT 0.2*  BILIDIR <0.1  IBILI NOT CALCULATED    Telemetry: no adverse rhythms Personally viewed.    Assessment/Plan:  79 year old with COPD exacerbation with type 2 NSTEMI, EF 40-45%, HTN, DM.   -Reviewed Dr. Eldridge Dace note. Agree with medical mgt. On ASA, cozaar, simvastatin. No Bb because of severe COPD.   -Troponin mildly elevated, Type 2 NSTEMI.   -EF 40-45%. On ARB. Avoid excessive fluids. No JVD now. Monitor.   -COPD main issue.   -Expressed the importance of her to move, physical therapy.     SKAINS, MARK 06/25/2012, 11:37 AM

## 2012-06-25 NOTE — Progress Notes (Signed)
Subjective: Wheezing better, still very weak, took a few steps in room  Objective: Vital signs in last 24 hours: Temp:  [97.5 F (36.4 C)-97.8 F (36.6 C)] 97.8 F (36.6 C) (03/09 0609) Pulse Rate:  [96-107] 96 (03/09 0609) Resp:  [21-22] 22 (03/09 0609) BP: (127-154)/(77-79) 154/78 mmHg (03/09 0609) SpO2:  [98 %-99 %] 99 % (03/09 0902) Weight:  [76.4 kg (168 lb 6.9 oz)] 76.4 kg (168 lb 6.9 oz) (03/09 0609) Weight change: 0.3 kg (10.6 oz) Last BM Date:  (unable to remember the exact date , claimed several days)  Intake/Output from previous day: 03/08 0701 - 03/09 0700 In: 478 [P.O.:478] Out: 500 [Urine:500] Intake/Output this shift: Total I/O In: 240 [P.O.:240] Out: -   General appearance: alert and cooperative Resp: fewer wheezes Cardio:RRR Extremities: no edema  Lab Results:  Recent Labs  06/24/12 0600  WBC 14.6*  HGB 12.7  HCT 39.1  PLT 346   BMET  Recent Labs  06/24/12 0600  NA 141  K 3.5  CL 97  CO2 35*  GLUCOSE 175*  BUN 40*  CREATININE 0.89  CALCIUM 10.0    Studies/Results: No results found.  Medications: I have reviewed the patient's current medications.  Assessment/Plan: Principal Problem:  Respiratory failure, acute-on-chronic -wheezing improved.Continue oral prednisone and change antibiotics to oral,  continue Mucinex and bronchodilators.  Active Problems:  COPD exacerbation/possible R lung pneumonia - improving slowly, medical therapy as above  DM (diabetes mellitus), type 2, uncontrolled - continue SSI, lantus increased and sugars improved HTN (hypertension) -on losartan with less likelihood for cough  Leukocytosis, stable.  Elevated troponin - trending down as per cardiology  Mild transaminitis -slight improvement, question etiology, possibly hepatic congestion or from a medication, recheck in am Restless legs syndrome -mirapex helping  Mouth pain - Magic mouthwash  Insomnia due to drug - Restoril 15 mg h.s. P.r.n.  Anxiety  state, unspecified - alprazolam q.6 hours p.r.n. Steroids reduced  PT/OT.  PT apparently saw pt but no note.  Daughter and patient interested in ST-rehab for reconditioning, will have SW see pt.    LOS: 4 days   GRIFFIN,JOHN JOSEPH 06/25/2012, 12:03 PM

## 2012-06-25 NOTE — Evaluation (Signed)
Physical Therapy Evaluation Patient Details Name: [name] MRN: [id number] DOB: [birth year] Today's Date: 06/25/2012 Time: 4098-1191 PT Time Calculation (min): 37 min  PT Assessment / Plan / Recommendation Clinical Impression  pt presents with COPD Exacerbation and NSTEMI.  pt very motivated to improve mobility, but limited by SOB and fatigue.  Discussed with pt seated ther ex to perform with Bil UEs and LEs.  At this point pt would benefit from ST-SNF at D/C to maximize Independence prior to return home alone.      PT Assessment  Patient needs continued PT services    Follow Up Recommendations  SNF    Does the patient have the potential to tolerate intense rehabilitation      Barriers to Discharge None      Equipment Recommendations  Rolling walker with 5" wheels    Recommendations for Other Services OT consult   Frequency Min 3X/week    Precautions / Restrictions Precautions Precautions: Fall Restrictions Weight Bearing Restrictions: No   Pertinent Vitals/Pain Denies pain.        Mobility  Bed Mobility Bed Mobility: Not assessed Transfers Transfers: Sit to Stand;Stand to Sit Sit to Stand: 4: Min guard;With upper extremity assist;From chair/3-in-1;With armrests Stand to Sit: 4: Min guard;With upper extremity assist;To chair/3-in-1;With armrests Details for Transfer Assistance: Repeated transfer x2 for strength and activity tolerance.  cues for UE use, pursed lip breathing.   Ambulation/Gait Ambulation/Gait Assistance: 4: Min guard Ambulation Distance (Feet): 3 Feet Assistive device: Rolling walker Ambulation/Gait Assistance Details: pt indicates feeling weak and SOB after only a few steps.  pt's O2 sats remained 96-98% on 2 L O2 during mobility.  With second time standing pt able to take steps in place working on pursed lip breathing.   Gait Pattern: Step-through pattern;Decreased stride length;Trunk flexed Stairs: No Wheelchair Mobility Wheelchair Mobility:  No    Exercises     PT Diagnosis: Difficulty walking (Decondition/Debility)  PT Problem List: Decreased activity tolerance;Decreased balance;Decreased mobility;Decreased knowledge of use of DME;Cardiopulmonary status limiting activity PT Treatment Interventions: DME instruction;Gait training;Stair training;Functional mobility training;Therapeutic activities;Therapeutic exercise;Balance training;Patient/family education   PT Goals Acute Rehab PT Goals PT Goal Formulation: With patient Time For Goal Achievement: 07/09/12 Potential to Achieve Goals: Good Pt will go Supine/Side to Sit: with modified independence PT Goal: Supine/Side to Sit - Progress: Goal set today Pt will go Sit to Supine/Side: with modified independence PT Goal: Sit to Supine/Side - Progress: Goal set today Pt will go Sit to Stand: with modified independence PT Goal: Sit to Stand - Progress: Goal set today Pt will go Stand to Sit: with modified independence PT Goal: Stand to Sit - Progress: Goal set today Pt will Ambulate: 51 - 150 feet;with supervision;with rolling walker PT Goal: Ambulate - Progress: Goal set today Pt will Go Up / Down Stairs: 3-5 stairs;with min assist;with rail(s) PT Goal: Up/Down Stairs - Progress: Goal set today  Visit Information  Last PT Received On: 06/25/12 Assistance Needed: +1    Subjective Data  Subjective: I'll do whatever I'm supposed to do.   Patient Stated Goal: Home   Prior Functioning  Home Living Lives With: Alone Available Help at Discharge: Family;Available PRN/intermittently Type of Home: Mobile home Home Access: Stairs to enter Entrance Stairs-Number of Steps: 3 Entrance Stairs-Rails: Left Home Layout: One level Bathroom Shower/Tub: Health visitor: Standard Bathroom Accessibility:  (Unsure) Home Adaptive Equipment: Shower chair with back;Straight cane;Walker - four wheeled Prior Function Level of Independence: Independent with assistive  device(s) Able to Take Stairs?: Yes Driving: No Vocation: Retired Musician: Surveyor, mining Overall Cognitive Status: Appears within functional limits for tasks assessed/performed Arousal/Alertness: Awake/alert Orientation Level: Appears intact for tasks assessed Behavior During Session: [facility] for tasks performed    Extremity/Trunk Assessment Right Lower Extremity Assessment RLE ROM/Strength/Tone: WFL for tasks assessed RLE Sensation: WFL - Light Touch Left Lower Extremity Assessment LLE ROM/Strength/Tone: WFL for tasks assessed LLE Sensation: WFL - Light Touch Trunk Assessment Trunk Assessment: Kyphotic   Balance Balance Balance Assessed: No  End of Session PT - End of Session Equipment Utilized During Treatment: Gait belt;Oxygen Activity Tolerance: Patient limited by fatigue Patient left: in chair;with call bell/phone within reach;with family/visitor present Nurse Communication: Mobility status  GP     [name], [name] [phone] 06/25/2012, 1:14 PM

## 2012-06-26 LAB — GLUCOSE, CAPILLARY
Glucose-Capillary: 247 mg/dL — ABNORMAL HIGH (ref 70–99)
Glucose-Capillary: 132 mg/dL — ABNORMAL HIGH (ref 70–99)

## 2012-06-26 LAB — CBC
HCT: 37.6 % (ref 36.0–46.0)
MCH: 29.2 pg (ref 26.0–34.0)
MCHC: 32.2 g/dL (ref 30.0–36.0)
MCV: 90.6 fL (ref 78.0–100.0)
Platelets: 361 10*3/uL (ref 150–400)
RDW: 13.6 % (ref 11.5–15.5)

## 2012-06-26 LAB — BASIC METABOLIC PANEL
BUN: 39 mg/dL — ABNORMAL HIGH (ref 6–23)
CO2: 36 mEq/L — ABNORMAL HIGH (ref 19–32)
Calcium: 9.6 mg/dL (ref 8.4–10.5)
Creatinine, Ser: 0.78 mg/dL (ref 0.50–1.10)
Glucose, Bld: 138 mg/dL — ABNORMAL HIGH (ref 70–99)

## 2012-06-26 MED ORDER — PREDNISONE 50 MG PO TABS
50.0000 mg | ORAL_TABLET | Freq: Every day | ORAL | Status: DC
  Administered 2012-06-27 – 2012-06-28 (×2): 50 mg via ORAL
  Filled 2012-06-26 (×3): qty 1

## 2012-06-26 MED ORDER — IPRATROPIUM BROMIDE 0.02 % IN SOLN
0.5000 mg | Freq: Four times a day (QID) | RESPIRATORY_TRACT | Status: DC
  Administered 2012-06-27 – 2012-06-28 (×6): 0.5 mg via RESPIRATORY_TRACT
  Filled 2012-06-26 (×6): qty 2.5

## 2012-06-26 MED ORDER — LEVALBUTEROL HCL 0.63 MG/3ML IN NEBU
0.6300 mg | INHALATION_SOLUTION | Freq: Four times a day (QID) | RESPIRATORY_TRACT | Status: DC
  Administered 2012-06-27 – 2012-06-28 (×6): 0.63 mg via RESPIRATORY_TRACT
  Filled 2012-06-26 (×13): qty 3

## 2012-06-26 NOTE — Progress Notes (Signed)
Subjective: Patient is still very weak but breathing better.  We'll plan to give prednisone 50 milligrams daily and taper by 10 milligrams every 2-3 days.  Patient will need skilled nursing facility placement with rehabilitation prior to going home.  Too weak to ambulate safely without assistance  Objective: Weight change: 0.49 kg (1 lb 1.3 oz)  Intake/Output Summary (Last 24 hours) at 06/26/12 0811 Last data filed at 06/26/12 0600  Gross per 24 hour  Intake   1318 ml  Output    301 ml  Net   1017 ml   Filed Vitals:   06/25/12 1533 06/25/12 1954 06/25/12 2112 06/26/12 0617  BP:  148/79  165/77  Pulse:  102  92  Temp:  97.5 F (36.4 C)  97.5 F (36.4 C)  TempSrc:  Oral  Oral  Resp:  22  22  Height:      Weight:    76.89 kg (169 lb 8.2 oz)  SpO2: 100% 96% 98% 100%   General Appearance: Alert, cooperative,appears less anxious than 3 days ago and more comfortable generally, appears stated age  Lungs: diminished breath sounds throughout  Heart: Regular rate and rhythm, S1 and S2 normal, no murmur, rub or gallop  Abdomen: Soft, non-tender, bowel sounds active all four quadrants, no masses, no organomegaly  Extremities: Extremities normal, atraumatic, no cyanosis or edema  Neuro: oriented x 3, nonfocal   Lab Results: Results for orders placed during the hospital encounter of 06/21/12 (from the past 48 hour(s))  GLUCOSE, CAPILLARY     Status: Abnormal   Collection Time    06/24/12  1:05 PM      Result Value Range   Glucose-Capillary 241 (*) 70 - 99 mg/dL  GLUCOSE, CAPILLARY     Status: Abnormal   Collection Time    06/24/12  4:33 PM      Result Value Range   Glucose-Capillary 212 (*) 70 - 99 mg/dL  GLUCOSE, CAPILLARY     Status: Abnormal   Collection Time    06/24/12  9:59 PM      Result Value Range   Glucose-Capillary 174 (*) 70 - 99 mg/dL  GLUCOSE, CAPILLARY     Status: Abnormal   Collection Time    06/25/12  4:47 AM      Result Value Range   Glucose-Capillary 168 (*)  70 - 99 mg/dL  GLUCOSE, CAPILLARY     Status: Abnormal   Collection Time    06/25/12 11:47 AM      Result Value Range   Glucose-Capillary 263 (*) 70 - 99 mg/dL   Comment 1 Notify RN    GLUCOSE, CAPILLARY     Status: Abnormal   Collection Time    06/25/12  5:14 PM      Result Value Range   Glucose-Capillary 285 (*) 70 - 99 mg/dL   Comment 1 Notify RN    GLUCOSE, CAPILLARY     Status: Abnormal   Collection Time    06/25/12  9:15 PM      Result Value Range   Glucose-Capillary 228 (*) 70 - 99 mg/dL  BASIC METABOLIC PANEL     Status: Abnormal   Collection Time    06/26/12  6:00 AM      Result Value Range   Sodium 142  135 - 145 mEq/L   Potassium 3.9  3.5 - 5.1 mEq/L   Chloride 100  96 - 112 mEq/L   CO2 36 (*) 19 - 32 mEq/L  Glucose, Bld 138 (*) 70 - 99 mg/dL   BUN 39 (*) 6 - 23 mg/dL   Creatinine, Ser 1.61  0.50 - 1.10 mg/dL   Calcium 9.6  8.4 - 09.6 mg/dL   GFR calc non Af Amer 75 (*) >90 mL/min   GFR calc Af Amer 87 (*) >90 mL/min   Comment:            The eGFR has been calculated     using the CKD EPI equation.     This calculation has not been     validated in all clinical     situations.     eGFR's persistently     <90 mL/min signify     possible Chronic Kidney Disease.  CBC     Status: Abnormal   Collection Time    06/26/12  6:00 AM      Result Value Range   WBC 13.1 (*) 4.0 - 10.5 K/uL   RBC 4.15  3.87 - 5.11 MIL/uL   Hemoglobin 12.1  12.0 - 15.0 g/dL   HCT 04.5  40.9 - 81.1 %   MCV 90.6  78.0 - 100.0 fL   MCH 29.2  26.0 - 34.0 pg   MCHC 32.2  30.0 - 36.0 g/dL   RDW 91.4  78.2 - 95.6 %   Platelets 361  150 - 400 K/uL  GLUCOSE, CAPILLARY     Status: Abnormal   Collection Time    06/26/12  6:02 AM      Result Value Range   Glucose-Capillary 135 (*) 70 - 99 mg/dL    Studies/Results: No results found. Medications: Scheduled Meds: . aspirin EC  325 mg Oral Daily  . azithromycin  500 mg Oral Daily  . cefUROXime  500 mg Oral BID WC  . enoxaparin  (LOVENOX) injection  40 mg Subcutaneous Q24H  . guaiFENesin  600 mg Oral BID  . insulin aspart  0-5 Units Subcutaneous QHS  . insulin aspart  0-9 Units Subcutaneous TID WC  . insulin aspart  3 Units Subcutaneous TID WC  . insulin glargine  15 Units Subcutaneous Daily  . ipratropium  0.5 mg Nebulization QID  . levalbuterol  0.63 mg Nebulization Q4H WA  . losartan  50 mg Oral Daily  . magic mouthwash  5 mL Oral QID  . mupirocin ointment  1 application Nasal BID  . polyethylene glycol  17 g Oral BID  . pramipexole  0.125 mg Oral TID  . [START ON 06/27/2012] predniSONE  50 mg Oral Q breakfast  . simvastatin  20 mg Oral QPM  . sodium chloride  3 mL Intravenous Q12H  . temazepam  15 mg Oral QHS,MR X 1   Continuous Infusions:  PRN Meds:.acetaminophen, acetaminophen, ALPRAZolam, guaiFENesin-dextromethorphan, levalbuterol, ondansetron (ZOFRAN) IV, ondansetron  Assessment/Plan: Principal Problem:   Respiratory failure, acute-on-chronic - wheezing improved.  Continue oral prednisone and oral antibiotics Active Problems:   COPD exacerbation - no pneumonia on chest x-ray   DM (diabetes mellitus), type 2, uncontrolled   HTN (hypertension) - slightly high systolically  Hyperlipidemia   Leukocytosis, unspecified   Elevated troponin - solving   Restless legs syndrome - continue Mirapex   Mouth pain - continue Magic mouthwash   Insomnia due to drug - Restoril as needed for sleep   Anxiety state, unspecified - alprazolam as needed for anxiety   Abnormal echocardiogram   PT/OT - ambulate with assistance and plan for skilled nursing facility placement for several weeks for rehabilitation  prior to                 going home    LOS: 5 days   Pearla Dubonnet, MD 06/26/2012, 8:11 AM

## 2012-06-26 NOTE — Evaluation (Signed)
Occupational Therapy Evaluation Patient Details Name: [name] MRN: [id number] DOB: [birth year] Today's Date: 06/26/2012 Time: 7829-5621 OT Time Calculation (min): 40 min  OT Assessment / Plan / Recommendation Clinical Impression  Pt is a pleasant 79 y/o female w/ acute exacerbation COPD. She currently demonstrates deficits in ADL's/self care and should benefit from acute OT followed by SNF rehab to assist w/ maximizing independence.    OT Assessment  Patient needs continued OT Services    Follow Up Recommendations  SNF    Barriers to Discharge Other (comment) (Pt lives alone)    Equipment Recommendations  None recommended by OT    Recommendations for Other Services    Frequency  Min 2X/week    Precautions / Restrictions Precautions Precautions: Fall Restrictions Weight Bearing Restrictions: No   Pertinent Vitals/Pain Pt w/o c/o pain    ADL  Grooming: Performed;Wash/dry hands;Wash/dry face;Teeth care;Brushing hair;Minimal assistance Where Assessed - Grooming: Supported sitting;Unsupported sitting Upper Body Bathing: Performed;Chest;Right arm;Left arm;Abdomen;Min guard;Set up;Supervision/safety (vc's for pursed lip breathing; E.C. tech's) Where Assessed - Upper Body Bathing: Supported sitting;Unsupported sitting Lower Body Bathing: Performed;Minimal assistance;Moderate assistance Where Assessed - Lower Body Bathing: Supported sit to stand;Supported sitting Upper Body Dressing: Performed;Supervision/safety;Set up Where Assessed - Upper Body Dressing: Supported sitting;Unsupported sitting Lower Body Dressing: Performed;Minimal assistance;Supervision/safety;Set up Where Assessed - Lower Body Dressing: Supported sit to stand;Unsupported sitting Toilet Transfer: Simulated;Minimal assistance Toilet Transfer Method: Sit to Barista: Bedside commode Toileting - Clothing Manipulation and Hygiene: Simulated;Minimal assistance Where Assessed - International aid/development worker and Hygiene: Sit on 3-in-1 or toilet;Standing Tub/Shower Transfer Method: Not assessed Transfers/Ambulation Related to ADLs: Pt amb in room during assessment and ADL retraining w/ min A. Pt fatigues easily secondary to fatigue, SOB. She requires VC's for pursed lip breathing and energy conservation tech's ADL Comments: Pt is a pleasant 79 y/o female w/ acute exacerbation COPD. She currently demonstrates deficits in ADL's/self care and should benefit from acute OT followed by SNF rehab to assist w/ maximizing independence.    OT Diagnosis: Generalized weakness;Other (comment) (SOB, fatigues easily)  OT Problem List: Decreased activity tolerance;Decreased knowledge of use of DME or AE;Cardiopulmonary status limiting activity OT Treatment Interventions: Self-care/ADL training;DME and/or AE instruction;Therapeutic activities;Patient/family education;Energy conservation   OT Goals Acute Rehab OT Goals OT Goal Formulation: With patient Potential to Achieve Goals: Good ADL Goals Pt Will Perform Grooming: with set-up;with supervision;Standing at sink;Sitting at sink ADL Goal: Grooming - Progress: Goal set today Pt Will Perform Lower Body Dressing: with set-up;with supervision;Sit to stand from chair;Sit to stand from bed;Unsupported;with adaptive equipment ADL Goal: Lower Body Dressing - Progress: Goal set today Pt Will Transfer to Toilet: with supervision;Ambulation;with DME;3-in-1 ADL Goal: Toilet Transfer - Progress: Goal set today Pt Will Perform Toileting - Clothing Manipulation: with supervision;Sitting on 3-in-1 or toilet;Standing ADL Goal: Toileting - Clothing Manipulation - Progress: Goal set today Pt Will Perform Toileting - Hygiene: with modified independence;Sitting on 3-in-1 or toilet ADL Goal: Toileting - Hygiene - Progress: Goal set today  Visit Information  Last OT Received On: 06/26/12 Assistance Needed: +1    Subjective Data  Subjective: "I'd like to get  up, I'm getting ansy sitting here in this bed" Patient Stated Goal: SNF rehab then home alone as able   Prior Functioning     Home Living Lives With: Alone Available Help at Discharge: Family;Available PRN/intermittently Type of Home: Mobile home Home Access: Stairs to enter Entrance Stairs-Number of Steps: 3 Entrance Stairs-Rails: Left Home Layout: One level  Bathroom Shower/Tub: Health visitor: Pharmacist, community:  (Unsure) Home Adaptive Equipment: Paediatric nurse with back;Straight cane;Walker - four wheeled;Grab bars in shower Prior Function Level of Independence: Independent with assistive device(s) Able to Take Stairs?: Yes Driving: No Vocation: Retired Musician: [employer] Dominant Hand: Right    Vision/Perception Vision - History Baseline Vision: Wears glasses all the time Patient Visual Report: No change from baseline   Cognition  Cognition Overall Cognitive Status: Appears within functional limits for tasks assessed/performed Arousal/Alertness: Awake/alert Orientation Level: Appears intact for tasks assessed Behavior During Session: [facility] for tasks performed    Extremity/Trunk Assessment Right Upper Extremity Assessment RUE ROM/Strength/Tone: Within functional levels;WFL for tasks assessed RUE Coordination: WFL - gross motor Left Upper Extremity Assessment LUE ROM/Strength/Tone: Within functional levels;WFL for tasks assessed LUE Coordination: WFL - gross motor     Mobility Bed Mobility Bed Mobility: Supine to Sit;Sitting - Scoot to Edge of Bed Supine to Sit: 4: Min assist;With rails;HOB elevated Sitting - Scoot to Edge of Bed: 5: Supervision;4: Min guard;With rail Transfers Transfers: Sit to Stand;Stand to Sit Sit to Stand: 4: Min guard;With upper extremity assist;From bed;From chair/3-in-1 Stand to Sit: 4: Min guard;5: Supervision;To chair/3-in-1;With upper extremity assist;With armrests            End of Session  OT - End of Session Activity Tolerance: Patient limited by fatigue Patient left: in chair;with call bell/phone within reach;with nursing in room  GO     Alm Bustard 06/26/2012, 12:51 PM

## 2012-06-26 NOTE — Progress Notes (Signed)
Physical Therapy Treatment Patient Details Name: [name] MRN: [id number] DOB: [birth year] Today's Date: 06/26/2012 Time: 4098-1191 PT Time Calculation (min): 20 min  PT Assessment / Plan / Recommendation Comments on Treatment Session  79 y.o female adm. for COPD exacerbation and NSTEMI. Progressign with ambulation however tolerance for activity still limited.     Follow Up Recommendations  SNF     Does the patient have the potential to tolerate intense rehabilitation     Barriers to Discharge        Equipment Recommendations  Rolling walker with 5" wheels    Recommendations for Other Services    Frequency Min 3X/week   Plan Discharge plan remains appropriate;Frequency remains appropriate    Precautions / Restrictions Precautions Precautions: Fall Restrictions Weight Bearing Restrictions: No   Pertinent Vitals/Pain 2 liters O2, DOE 1-3/4 during ambulation; SaO2 95%    Mobility  Bed Mobility Bed Mobility: Not assessed Supine to Sit: 4: Min assist;With rails;HOB elevated Sitting - Scoot to Edge of Bed: 5: Supervision;4: Min guard;With rail Transfers Transfers: Sit to Stand;Stand to Sit Sit to Stand: 5: Supervision;From chair/3-in-1;With upper extremity assist Stand to Sit: 5: Supervision;To chair/3-in-1;With upper extremity assist Details for Transfer Assistance: pt able to verbalize safe hand placement with regards to RW, gaurding for safety Ambulation/Gait Ambulation/Gait Assistance: 4: Min guard Ambulation Distance (Feet): 30 Feet Assistive device: Rolling walker Ambulation/Gait Assistance Details: cues for tall posture and to refrain from talking and walking so as to conserve energy; Spo2 95% on 2l iters however pt very short of breath with DOE 3/4 towards end of ambulation; requires several standing rest breaks and cued for correct technique with pursed lip breathing Gait Pattern: Trunk flexed    Exercises General Exercises - Lower Extremity Ankle  Circles/Pumps: AROM;Both;10 reps;Seated    PT Goals Acute Rehab PT Goals PT Goal: Sit to Stand - Progress: Progressing toward goal PT Goal: Stand to Sit - Progress: Progressing toward goal PT Goal: Ambulate - Progress: Progressing toward goal  Visit Information  Last PT Received On: 06/26/12 Assistance Needed: +1    Subjective Data  Subjective: Well I did much better than I thought I did.  Patient Stated Goal: strong   Cognition  Cognition Overall Cognitive Status: Appears within functional limits for tasks assessed/performed Arousal/Alertness: Awake/alert Orientation Level: Appears intact for tasks assessed Behavior During Session: [facility] for tasks performed    Balance  Balance Balance Assessed: No  End of Session PT - End of Session Equipment Utilized During Treatment: Gait belt Activity Tolerance: Patient tolerated treatment well Patient left: in chair Nurse Communication: Mobility status   GP     [facility] HELEN 06/26/2012, 4:05 PM

## 2012-06-27 LAB — GLUCOSE, CAPILLARY
Glucose-Capillary: 105 mg/dL — ABNORMAL HIGH (ref 70–99)
Glucose-Capillary: 199 mg/dL — ABNORMAL HIGH (ref 70–99)

## 2012-06-27 NOTE — Clinical Social Work Placement (Addendum)
    Clinical Social Work Department CLINICAL SOCIAL WORK PLACEMENT NOTE 06/27/2012  Patient:  [name], [name]  Account Number:  [id number] Admit date:  06/21/2012  Clinical Social Worker:  Lupita Leash CROWDER, LCSWA  Date/time:  06/26/2012 03:15 PM  Clinical Social Work is seeking post-discharge placement for this patient at the following level of care:   SKILLED NURSING   (*CSW will update this form in Epic as items are completed)   06/26/2012  Patient/family provided with [facility] Department of Clinical Social Work's list of facilities offering this level of care within the geographic area requested by the patient (or if unable, by the patient's family).  06/26/2012  Patient/family informed of their freedom to choose among providers that offer the needed level of care, that participate in Medicare, Medicaid or managed care program needed by the patient, have an available bed and are willing to accept the patient.  06/26/2012  Patient/family informed of [facility]' ownership interest in [facility], as well as of the fact that they are under no obligation to receive care at this facility.  PASARR submitted to EDS on 06/26/2012 PASARR number received from [employer] on 06/26/2012  FL2 transmitted to all facilities in geographic area requested by pt/family on  06/27/12 FL2 transmitted to all facilities within larger geographic area on   Patient informed that his/her managed care company has contracts with or will negotiate with  certain facilities, including the following:   [employer]-  Carollee Herter is [facility]   Authorization requestioned   Patient/family informed of bed offers received:  06/27/12 Patient chooses bed at [facility]  Physician recommends and patient chooses bed at    Patient to be transferred to  [facility] on    Patient to be transferred to facility by Ambulance  [facility])  The following physician request were entered in Epic:   Additional Comments: 06/28/12   Patient was d/c to SNF today. Patient and family were pleased with d/c plan.  Notified SNF and patient's nurse of d/c.  No further CSW needs identified.  Lorri Frederick. [place]  [phone]

## 2012-06-27 NOTE — Progress Notes (Signed)
Inpatient Diabetes Program Recommendations  AACE/ADA: New Consensus Statement on Inpatient Glycemic Control (2013)  Target Ranges:  Prepandial:   less than 140 mg/dL      Peak postprandial:   less than 180 mg/dL (1-2 hours)      Critically ill patients:  140 - 180 mg/dL  Post-prandial glucose still a little elevated.  While patient is on prednisone greater than 20 mg/day: Inpatient Diabetes Program Recommendations Insulin - Meal Coverage: Please consider increase in meal coerage to 4-5 units tidwc  Thank you, Lenor Coffin, RN, CNS, Diabetes Coordinator [phone])

## 2012-06-27 NOTE — Progress Notes (Signed)
Subjective: [name] is feeling much better today.  Respiratory distress much improved.  Still very weak.  Not eating very well  Objective: Weight change: -0.59 kg (-1 lb 4.8 oz)  Intake/Output Summary (Last 24 hours) at 06/27/12 0819 Last data filed at 06/26/12 1851  Gross per 24 hour  Intake    723 ml  Output    825 ml  Net   -102 ml   Filed Vitals:   06/26/12 1545 06/26/12 2043 06/27/12 0500 06/27/12 0600  BP:  174/72  148/77  Pulse:  93  86  Temp:  97.6 F (36.4 C)  97.5 F (36.4 C)  TempSrc:  Oral  Oral  Resp:  20  19  Height:      Weight:   76.3 kg (168 lb 3.4 oz) 76.3 kg (168 lb 3.4 oz)  SpO2: 95% 97%  97%   General Appearance: Alert, cooperative,appears less anxious than 3 days ago and more comfortable generally, appears stated age  Lungs: diminished breath sounds throughout  Heart: Regular rate and rhythm, S1 and S2 normal, no murmur, rub or gallop  Abdomen: Soft, non-tender, bowel sounds active all four quadrants, no masses, no organomegaly  Extremities: Extremities normal, atraumatic, no cyanosis or edema  Neuro: oriented x 3, nonfocal  Lab Results: Results for orders placed during the hospital encounter of 06/21/12 (from the past 48 hour(s))  GLUCOSE, CAPILLARY     Status: Abnormal   Collection Time    06/25/12 11:47 AM      Result Value Range   Glucose-Capillary 263 (*) 70 - 99 mg/dL   Comment 1 Notify RN    GLUCOSE, CAPILLARY     Status: Abnormal   Collection Time    06/25/12  5:14 PM      Result Value Range   Glucose-Capillary 285 (*) 70 - 99 mg/dL   Comment 1 Notify RN    GLUCOSE, CAPILLARY     Status: Abnormal   Collection Time    06/25/12  9:15 PM      Result Value Range   Glucose-Capillary 228 (*) 70 - 99 mg/dL  BASIC METABOLIC PANEL     Status: Abnormal   Collection Time    06/26/12  6:00 AM      Result Value Range   Sodium 142  135 - 145 mEq/L   Potassium 3.9  3.5 - 5.1 mEq/L   Chloride 100  96 - 112 mEq/L   CO2 36 (*) 19 - 32 mEq/L    Glucose, Bld 138 (*) 70 - 99 mg/dL   BUN 39 (*) 6 - 23 mg/dL   Creatinine, Ser 1.19  0.50 - 1.10 mg/dL   Calcium 9.6  8.4 - 14.7 mg/dL   GFR calc non Af Amer 75 (*) >90 mL/min   GFR calc Af Amer 87 (*) >90 mL/min   Comment:            The eGFR has been calculated     using the CKD EPI equation.     This calculation has not been     validated in all clinical     situations.     eGFR's persistently     <90 mL/min signify     possible Chronic Kidney Disease.  CBC     Status: Abnormal   Collection Time    06/26/12  6:00 AM      Result Value Range   WBC 13.1 (*) 4.0 - 10.5 K/uL   RBC 4.15  3.87 - 5.11 MIL/uL   Hemoglobin 12.1  12.0 - 15.0 g/dL   HCT 40.9  81.1 - 91.4 %   MCV 90.6  78.0 - 100.0 fL   MCH 29.2  26.0 - 34.0 pg   MCHC 32.2  30.0 - 36.0 g/dL   RDW 78.2  95.6 - 21.3 %   Platelets 361  150 - 400 K/uL  GLUCOSE, CAPILLARY     Status: Abnormal   Collection Time    06/26/12  6:02 AM      Result Value Range   Glucose-Capillary 135 (*) 70 - 99 mg/dL  GLUCOSE, CAPILLARY     Status: Abnormal   Collection Time    06/26/12 11:02 AM      Result Value Range   Glucose-Capillary 208 (*) 70 - 99 mg/dL  GLUCOSE, CAPILLARY     Status: Abnormal   Collection Time    06/26/12  4:23 PM      Result Value Range   Glucose-Capillary 247 (*) 70 - 99 mg/dL  GLUCOSE, CAPILLARY     Status: Abnormal   Collection Time    06/26/12 10:12 PM      Result Value Range   Glucose-Capillary 132 (*) 70 - 99 mg/dL   Comment 1 Documented in Chart     Comment 2 Notify RN    GLUCOSE, CAPILLARY     Status: Abnormal   Collection Time    06/27/12  5:58 AM      Result Value Range   Glucose-Capillary 105 (*) 70 - 99 mg/dL    Studies/Results: No results found. Medications: Scheduled Meds: . aspirin EC  325 mg Oral Daily  . azithromycin  500 mg Oral Daily  . cefUROXime  500 mg Oral BID WC  . enoxaparin (LOVENOX) injection  40 mg Subcutaneous Q24H  . guaiFENesin  600 mg Oral BID  . insulin aspart   0-5 Units Subcutaneous QHS  . insulin aspart  0-9 Units Subcutaneous TID WC  . insulin aspart  3 Units Subcutaneous TID WC  . insulin glargine  15 Units Subcutaneous Daily  . ipratropium  0.5 mg Nebulization QID  . levalbuterol  0.63 mg Nebulization QID  . losartan  50 mg Oral Daily  . magic mouthwash  5 mL Oral QID  . polyethylene glycol  17 g Oral BID  . pramipexole  0.125 mg Oral TID  . predniSONE  50 mg Oral Q breakfast  . simvastatin  20 mg Oral QPM  . sodium chloride  3 mL Intravenous Q12H  . temazepam  15 mg Oral QHS,MR X 1   Continuous Infusions:  PRN Meds:.acetaminophen, acetaminophen, ALPRAZolam, guaiFENesin-dextromethorphan, levalbuterol, ondansetron (ZOFRAN) IV, ondansetron  Assessment/Plan:  Principal Problem:  Respiratory failure, acute-on-chronic - wheezing improved. Continue oral prednisone and oral antibiotics   Active Problems:  COPD exacerbation - no pneumonia on chest x-ray  DM (diabetes mellitus), type 2, uncontrolled  HTN (hypertension) - slightly high systolically Hyperlipidemia  Leukocytosis, unspecified  Elevated troponin - resolving  Restless legs syndrome - continue Mirapex  Mouth pain - continue Magic mouthwash  Insomnia due to drug - Restoril as needed for sleep  Anxiety state, unspecified - alprazolam as needed for anxiety  Abnormal echocardiogram  PT/OT - ambulate with assistance and plan for skilled nursing facility placement for several weeks for rehabilitation prior to going home  Disp:  SNF placement for Rehab   LOS: 6 days   Pearla Dubonnet, MD 06/27/2012, 8:19 AM

## 2012-06-28 ENCOUNTER — Inpatient Hospital Stay (HOSPITAL_COMMUNITY): Payer: Medicare Other

## 2012-06-28 DIAGNOSIS — J189 Pneumonia, unspecified organism: Secondary | ICD-10-CM | POA: Diagnosis present

## 2012-06-28 LAB — CREATININE, SERUM
Creatinine, Ser: 0.77 mg/dL (ref 0.50–1.10)
GFR calc Af Amer: 88 mL/min — ABNORMAL LOW (ref >90)
GFR calc non Af Amer: 76 mL/min — ABNORMAL LOW (ref >90)

## 2012-06-28 LAB — GLUCOSE, CAPILLARY: Glucose-Capillary: 282 mg/dL — ABNORMAL HIGH (ref 70–99)

## 2012-06-28 MED ORDER — CEFUROXIME AXETIL 500 MG PO TABS: 500.0000 mg | ORAL_TABLET | Freq: Two times a day (BID) | ORAL | Status: AC

## 2012-06-28 MED ORDER — POLYETHYLENE GLYCOL 3350 17 G PO PACK: 17.0000 g | PACK | Freq: Every day | ORAL | Status: AC

## 2012-06-28 MED ORDER — GUAIFENESIN ER 600 MG PO TB12: 600.0000 mg | ORAL_TABLET | Freq: Two times a day (BID) | ORAL | Status: AC

## 2012-06-28 MED ORDER — INSULIN ASPART 100 UNIT/ML ~~LOC~~ SOLN: 0.0000 [IU] | Freq: Every day | SUBCUTANEOUS | Status: DC

## 2012-06-28 MED ORDER — HYDROCHLOROTHIAZIDE 25 MG PO TABS: 12.5000 mg | ORAL_TABLET | Freq: Every day | ORAL | Status: DC

## 2012-06-28 MED ORDER — AZITHROMYCIN 500 MG PO TABS: 500.0000 mg | ORAL_TABLET | Freq: Every day | ORAL | Status: AC

## 2012-06-28 MED ORDER — TEMAZEPAM 15 MG PO CAPS: 15.0000 mg | ORAL_CAPSULE | Freq: Every evening | ORAL | Status: AC | PRN

## 2012-06-28 MED ORDER — LEVALBUTEROL HCL 0.63 MG/3ML IN NEBU: 0.6300 mg | INHALATION_SOLUTION | Freq: Four times a day (QID) | RESPIRATORY_TRACT | Status: DC

## 2012-06-28 MED ORDER — ACETAMINOPHEN 325 MG PO TABS: 650.0000 mg | ORAL_TABLET | Freq: Four times a day (QID) | ORAL | Status: AC | PRN

## 2012-06-28 MED ORDER — MAGIC MOUTHWASH: 5.0000 mL | Freq: Four times a day (QID) | ORAL | Status: AC

## 2012-06-28 MED ORDER — PREDNISONE 10 MG PO TABS: ORAL_TABLET | ORAL | Status: DC

## 2012-06-28 MED ORDER — LEVALBUTEROL HCL 0.63 MG/3ML IN NEBU: 0.6300 mg | INHALATION_SOLUTION | RESPIRATORY_TRACT | Status: DC | PRN

## 2012-06-28 MED ORDER — INSULIN ASPART 100 UNIT/ML ~~LOC~~ SOLN: 0.0000 [IU] | Freq: Three times a day (TID) | SUBCUTANEOUS | Status: DC

## 2012-06-28 NOTE — Discharge Summary (Signed)
Physician Discharge Summary  NAME:[name]  ZOX:[id number]  DOB: [birth year]   Admit date: 06/21/2012 Discharge date: 06/28/2012  Discharge Diagnoses:  Principal Problem:   Respiratory failure, acute-on-chronic - right lower lobe pneumonia and possibly right middle lobe pneumonia as well as COPD exacerbation, improved Active Problems:   COPD exacerbation - symptomatically improved   DM (diabetes mellitus), type 2, uncontrolled - treated with Lantus and sliding scale NovoLog insulin and metformin was held, but to be resumed   HTN (hypertension)   Hyperlipidemia   Leukocytosis, unspecified - improved   Elevated troponin   NSTEMI (non-ST elevated myocardial infarction) - probable small infarction with no intervention - [facility] consult   Restless legs syndrome - dramatically controlled   Mouth pain - improved with Magic mouthwash   Insomnia due to drug - treated well with Restoril and reduction of steroids and changing of bronchodilators to Xopenex   Anxiety state, unspecified - controlled   Abnormal echocardiogram   CAP (community acquired pneumonia) - right lower lobe and possibly right middle lobe   Discharge Physical Exam:  General Appearance: Alert, cooperative, no distress, appears stated age  Weight change: -0.005 kg (-0.2 oz)  Intake/Output Summary (Last 24 hours) at 06/28/12 0806 Last data filed at 06/27/12 1900  Gross per 24 hour  Intake    963 ml  Output    500 ml  Net    463 ml   Filed Vitals:   06/27/12 1042 06/27/12 1315 06/27/12 2100 06/28/12 0520  BP: 135/75 132/77 144/79 160/65  Pulse: 102 116 96 105  Temp: 98.1 F (36.7 C) 97.9 F (36.6 C) 97.7 F (36.5 C) 97 F (36.1 C)  TempSrc: Oral Oral Oral Oral  Resp: 20 19 18 18   Height:      Weight:    76.295 kg (168 lb 3.2 oz)  SpO2: 95% 97% 99% 99%    General Appearance: Alert, cooperative,appears less anxious than 3 days ago and more comfortable generally, appears stated age  Lungs: diminished  breath sounds throughout, no wheezing  Heart: Regular rate and rhythm, S1 and S2 normal, no murmur, rub or gallop  Abdomen: Soft, non-tender, bowel sounds active all four quadrants, no masses, no organomegaly  Extremities: Extremities normal, atraumatic, no cyanosis or edema  Neuro: oriented x 3, nonfocal   Discharge Condition: Much improved  Hospital Course:  [name] is a very pleasant 79 year old female who I followed for many years as an outpatient.  She has a 5 year history of O2-dependent COPD, type 2 diabetes mellitus, hypertension, hyperlipidemia, and breast cancer status post radiation.  On the day of admission she became acutely short of breath after going out of her house to collect her mail in newspaper.  He was extremely cold and when she returned to the house she was having severe dyspnea.  Her dyspnea had increased from the previous several days.  She experienced chest tightness and wheezing and orthopnea.  EMS was called and she was brought to the [facility] emergency room.  She was treated with Solu-Medrol en route.  In the emergency room she received IV Rocephin and azithromycin and aspirin as well as albuterol nebulization therapy and significantly improved symptomatically. While hospitalized she did have a mild elevation in troponin and CPK and [facility] was consulted and it was felt that she likely had a very small non-STEMI secondary to stress of hypoxemia related to her COPD and pneumonia. She has symptomatically been slowly improving and now  is wheeze free.  Still very weak and needs physical therapy and occupational therapy prior to returning home health and will be discharged to [facility] skilled nursing facility this morning.  We'll check repeat chest x-ray prior to discharge.  Her chest x-ray did reveal some hilar enlargement on the right as well as a right lower lobe pneumonia and possibly right middle lobe pneumonia and repeat chest x-ray will be  obtained this morning prior to discharge.  Further followup will be necessary to measure this all resolves over the next several weeks.  Things to follow up in the outpatient setting:  Need to follow blood sugars as well as her respiratory status and improve strength with physical therapy and occupational therapy  Consults: Treatment Team:  [facility] -  Corky Crafts, MD  Disposition:   Discharge Orders   Future Orders Complete By Expires     Call MD for:  difficulty breathing, headache or visual disturbances  As directed     Call MD for:  hives  As directed     Call MD for:  severe uncontrolled pain  As directed     Call MD for:  temperature >100.4  As directed     Diet - low sodium heart healthy  As directed     Discharge instructions  As directed     Comments:      Monitor pulse ox and use nasal cannula O2 to keep O2 saturations greater than 92% at all times Start PT OT and skilled nursing facility At discharge from skilled nursing facility, schedule appointment with Dr. Pearla Dubonnet within one week of discharge - called to [phone] or [phone] for appointment    Increase activity slowly  As directed         Medication List    STOP taking these medications       DUONEB 0.5-2.5 (3) MG/3ML Soln  Generic drug:  ipratropium-albuterol      TAKE these medications       acetaminophen 325 MG tablet  Commonly known as:  TYLENOL  Take 2 tablets (650 mg total) by mouth every 6 (six) hours as needed for pain or fever.     ADVAIR DISKUS 250-50 MCG/DOSE Aepb  Generic drug:  Fluticasone-Salmeterol  Inhale 1 puff into the lungs every 12 (twelve) hours.     aspirin EC 81 MG tablet  Take 81 mg by mouth daily.     azithromycin 500 MG tablet  Commonly known as:  ZITHROMAX  Take 1 tablet (500 mg total) by mouth daily.     cefUROXime 500 MG tablet  Commonly known as:  CEFTIN  Take 1 tablet (500 mg total) by mouth 2 (two) times daily with a meal.     guaiFENesin  600 MG 12 hr tablet  Commonly known as:  MUCINEX  Take 1 tablet (600 mg total) by mouth 2 (two) times daily.     hydrochlorothiazide 25 MG tablet  Commonly known as:  HYDRODIURIL  Take 0.5 tablets (12.5 mg total) by mouth daily.     insulin aspart 100 UNIT/ML injection  Commonly known as:  novoLOG  Inject 0-5 Units into the skin at bedtime.     insulin aspart 100 UNIT/ML injection  Commonly known as:  novoLOG  Inject 0-9 Units into the skin 3 (three) times daily with meals.     levalbuterol 0.63 MG/3ML nebulizer solution  Commonly known as:  XOPENEX  Take 3 mLs (0.63 mg total) by nebulization every  2 (two) hours as needed for wheezing or shortness of breath.     levalbuterol 0.63 MG/3ML nebulizer solution  Commonly known as:  XOPENEX  Take 3 mLs (0.63 mg total) by nebulization 4 (four) times daily.     lisinopril 10 MG tablet  Commonly known as:  PRINIVIL,ZESTRIL  Take 10 mg by mouth daily.     magic mouthwash Soln  Take 5 mLs by mouth 4 (four) times daily.     metFORMIN 500 MG tablet  Commonly known as:  GLUCOPHAGE  Take 500 mg by mouth 2 (two) times daily with a meal.     polyethylene glycol packet  Commonly known as:  MIRALAX / GLYCOLAX  Take 17 g by mouth daily.     predniSONE 10 MG tablet  Commonly known as:  DELTASONE  Take 40 milligrams orally daily for 3 days then 30 milligrams orally daily for 3 days then 20 milligrams orally daily for 3 days then 10 milligrams orally daily for 3 days and then discontinue     simvastatin 40 MG tablet  Commonly known as:  ZOCOR  Take 40 mg by mouth every evening.     temazepam 15 MG capsule  Commonly known as:  RESTORIL  Take 1 capsule (15 mg total) by mouth at bedtime and may repeat dose one time if needed.         The results of significant diagnostics from this hospitalization (including imaging, microbiology, ancillary and laboratory) are listed below for reference.    Significant Diagnostic Studies: Dg Chest Port  1 View  06/23/2012  *RADIOLOGY REPORT*  Clinical Data: Patient.  Possible pneumonia.  PORTABLE CHEST - 1 VIEW  Comparison: Chest x-ray 06/21/2012.  Findings: Lung volumes are normal.  Prominent soft tissue in the right hilar region.  Interstitial prominence and patchy ill-defined airspace opacities throughout the right mid to lower lung, concerning for possible right lower lobe bronchopneumonia.  There may also be involvement of the medial segment of the right middle lobe, as evidenced by partial obscuration of the right heart border.  Left lung appears relatively clear.  No definite pleural effusions.  No evidence of pulmonary edema.  Heart size is normal. The patient is rotated to the right on today's exam, resulting in distortion of the mediastinal contours and reduced diagnostic sensitivity and specificity for mediastinal pathology. Atherosclerosis of the thoracic aorta.  High density structures seen projecting over the mediastinum are favored to be exterior to the patient, as these are new compared to prior studies.  IMPRESSION: 1.  Findings, as above, concerning for right lower lobe, and possible right middle lobe bronchopneumonia.  Given the soft tissue prominence in the right hilar region, follow-up radiographs are recommended until complete resolution of these findings has been observed (i.e., to exclude a centrally obstructing neoplasm). Alternatively, if there is clinical suspicion for malignancy, further evaluation with contrast enhanced chest CT could be performed at this time. 2.  Atherosclerosis.   Original Report Authenticated By: Trudie Reed, M.D.    Dg Chest Port 1 View  06/21/2012  *RADIOLOGY REPORT*  Clinical Data: Shortness of breath and cough.  PORTABLE CHEST - 1 VIEW  Comparison: Chest radiograph performed 06/12/2010  Findings: The lungs are mildly hyperexpanded, with flattening of the hemidiaphragms, likely reflecting COPD.  Vascular congestion is noted, with peribronchial thickening.   Apparent mild bibasilar and right mid lung opacities may reflect atelectasis or possibly mild pneumonia.  No definite pleural effusion or pneumothorax is seen, though the right  costophrenic angle is incompletely imaged on this study.  The cardiomediastinal silhouette is normal in size; calcification is noted within the aortic arch.  No acute osseous abnormalities are seen.  IMPRESSION:  1.  Apparent mild bibasilar and right mid lung opacities may reflect atelectasis or possibly mild pneumonia. 2.  Findings suggest COPD.  Vascular congestion noted, with peribronchial thickening.   Original Report Authenticated By: Tonia Ghent, M.D.     Microbiology: Recent Results (from the past 240 hour(s))  MRSA PCR SCREENING     Status: Abnormal   Collection Time    06/21/12  9:22 AM      Result Value Range Status   MRSA by PCR POSITIVE (*) NEGATIVE Final   Comment:            The GeneXpert MRSA Assay (FDA     approved for NASAL specimens     only), is one component of a     comprehensive MRSA colonization     surveillance program. It is not     intended to diagnose MRSA     infection nor to guide or     monitor treatment for     MRSA infections.     RESULT CALLED TO, READ BACK BY AND VERIFIED WITH:     W. DAVIS RN 12:47 06/21/12 ([email or web address])     Labs: Results for orders placed during the hospital encounter of 06/21/12  MRSA PCR SCREENING      Result Value Range   MRSA by PCR POSITIVE (*) NEGATIVE  CBC WITH DIFFERENTIAL      Result Value Range   WBC 19.2 (*) 4.0 - 10.5 K/uL   RBC 4.31  3.87 - 5.11 MIL/uL   Hemoglobin 12.7  12.0 - 15.0 g/dL   HCT 16.1  09.6 - 04.5 %   MCV 87.0  78.0 - 100.0 fL   MCH 29.5  26.0 - 34.0 pg   MCHC 33.9  30.0 - 36.0 g/dL   RDW 40.9  81.1 - 91.4 %   Platelets 366  150 - 400 K/uL   Neutrophils Relative 74  43 - 77 %   Neutro Abs 14.1 (*) 1.7 - 7.7 K/uL   Lymphocytes Relative 19  12 - 46 %   Lymphs Abs 3.6  0.7 - 4.0 K/uL   Monocytes Relative 7  3 - 12 %    Monocytes Absolute 1.3 (*) 0.1 - 1.0 K/uL   Eosinophils Relative 1  0 - 5 %   Eosinophils Absolute 0.1  0.0 - 0.7 K/uL   Basophils Relative 0  0 - 1 %   Basophils Absolute 0.1  0.0 - 0.1 K/uL  COMPREHENSIVE METABOLIC PANEL      Result Value Range   Sodium 136  135 - 145 mEq/L   Potassium 4.1  3.5 - 5.1 mEq/L   Chloride 95 (*) 96 - 112 mEq/L   CO2 26  19 - 32 mEq/L   Glucose, Bld 194 (*) 70 - 99 mg/dL   BUN 30 (*) 6 - 23 mg/dL   Creatinine, Ser 7.82 (*) 0.50 - 1.10 mg/dL   Calcium 9.6  8.4 - 95.6 mg/dL   Total Protein 7.2  6.0 - 8.3 g/dL   Albumin 3.6  3.5 - 5.2 g/dL   AST 98 (*) 0 - 37 U/L   ALT 52 (*) 0 - 35 U/L   Alkaline Phosphatase 100  39 - 117 U/L   Total Bilirubin 0.8  0.3 - 1.2 mg/dL  GFR calc non Af Amer 43 (*) >90 mL/min   GFR calc Af Amer 50 (*) >90 mL/min  TROPONIN I      Result Value Range   Troponin I 0.40 (*) <0.30 ng/mL  PRO B NATRIURETIC PEPTIDE      Result Value Range   Pro B Natriuretic peptide (BNP) 1885.0 (*) 0 - 450 pg/mL  BLOOD GAS, ARTERIAL      Result Value Range   O2 Content 2.0     Delivery systems NASAL CANNULA     pH, Arterial 7.375  7.350 - 7.450   pCO2 arterial 38.6  35.0 - 45.0 mmHg   pO2, Arterial 72.2 (*) 80.0 - 100.0 mmHg   Bicarbonate 22.1  20.0 - 24.0 mEq/L   TCO2 23.2  0 - 100 mmol/L   Acid-base deficit 2.3 (*) 0.0 - 2.0 mmol/L   O2 Saturation 94.7     Patient temperature 98.6     Collection site LEFT RADIAL     Drawn by [id number]     Sample type ARTERIAL DRAW     Allens test (pass/fail) PASS  PASS  TROPONIN I      Result Value Range   Troponin I 0.74 (*) <0.30 ng/mL  TROPONIN I      Result Value Range   Troponin I 0.74 (*) <0.30 ng/mL  TROPONIN I      Result Value Range   Troponin I 0.62 (*) <0.30 ng/mL  HEMOGLOBIN A1C      Result Value Range   Hemoglobin A1C 7.3 (*) <5.7 %   Mean Plasma Glucose 163 (*) <117 mg/dL  GLUCOSE, CAPILLARY      Result Value Range   Glucose-Capillary 265 (*) 70 - 99 mg/dL  GLUCOSE, CAPILLARY       Result Value Range   Glucose-Capillary 341 (*) 70 - 99 mg/dL   Comment 1 Documented in Chart     Comment 2 Notify RN    CK TOTAL AND CKMB      Result Value Range   Total CK 389 (*) 7 - 177 U/L   CK, MB 19.0 (*) 0.3 - 4.0 ng/mL   Relative Index 4.9 (*) 0.0 - 2.5  GLUCOSE, CAPILLARY      Result Value Range   Glucose-Capillary 270 (*) 70 - 99 mg/dL  BASIC METABOLIC PANEL      Result Value Range   Sodium 139  135 - 145 mEq/L   Potassium 3.3 (*) 3.5 - 5.1 mEq/L   Chloride 98  96 - 112 mEq/L   CO2 29  19 - 32 mEq/L   Glucose, Bld 207 (*) 70 - 99 mg/dL   BUN 34 (*) 6 - 23 mg/dL   Creatinine, Ser 1.61  0.50 - 1.10 mg/dL   Calcium 9.0  8.4 - 09.6 mg/dL   GFR calc non Af Amer 53 (*) >90 mL/min   GFR calc Af Amer 62 (*) >90 mL/min  CBC      Result Value Range   WBC 13.1 (*) 4.0 - 10.5 K/uL   RBC 3.71 (*) 3.87 - 5.11 MIL/uL   Hemoglobin 11.0 (*) 12.0 - 15.0 g/dL   HCT 04.5 (*) 40.9 - 81.1 %   MCV 85.4  78.0 - 100.0 fL   MCH 29.6  26.0 - 34.0 pg   MCHC 34.7  30.0 - 36.0 g/dL   RDW 91.4  78.2 - 95.6 %   Platelets 272  150 - 400 K/uL  HEPATIC FUNCTION PANEL  Result Value Range   Total Protein 6.5  6.0 - 8.3 g/dL   Albumin 3.0 (*) 3.5 - 5.2 g/dL   AST 98 (*) 0 - 37 U/L   ALT 58 (*) 0 - 35 U/L   Alkaline Phosphatase 85  39 - 117 U/L   Total Bilirubin 0.2 (*) 0.3 - 1.2 mg/dL   Bilirubin, Direct <1.6  0.0 - 0.3 mg/dL   Indirect Bilirubin NOT CALCULATED  0.3 - 0.9 mg/dL  GLUCOSE, CAPILLARY      Result Value Range   Glucose-Capillary 232 (*) 70 - 99 mg/dL   Comment 1 Notify RN     Comment 2 Documented in Chart    GLUCOSE, CAPILLARY      Result Value Range   Glucose-Capillary 208 (*) 70 - 99 mg/dL   Comment 1 Documented in Chart     Comment 2 Notify RN    GLUCOSE, CAPILLARY      Result Value Range   Glucose-Capillary 215 (*) 70 - 99 mg/dL   Comment 1 Documented in Chart     Comment 2 Notify RN    GLUCOSE, CAPILLARY      Result Value Range   Glucose-Capillary 183 (*) 70  - 99 mg/dL   Comment 1 Notify RN     Comment 2 Documented in Chart    GLUCOSE, CAPILLARY      Result Value Range   Glucose-Capillary 177 (*) 70 - 99 mg/dL   Comment 1 Notify RN     Comment 2 Documented in Chart    GLUCOSE, CAPILLARY      Result Value Range   Glucose-Capillary 102 (*) 70 - 99 mg/dL   Comment 1 Notify RN     Comment 2 Documented in Chart    GLUCOSE, CAPILLARY      Result Value Range   Glucose-Capillary 202 (*) 70 - 99 mg/dL   Comment 1 Documented in Chart     Comment 2 Notify RN    TROPONIN I      Result Value Range   Troponin I 0.53 (*) <0.30 ng/mL  CK TOTAL AND CKMB      Result Value Range   Total CK 170  7 - 177 U/L   CK, MB 16.5 (*) 0.3 - 4.0 ng/mL   Relative Index 9.7 (*) 0.0 - 2.5  GLUCOSE, CAPILLARY      Result Value Range   Glucose-Capillary 282 (*) 70 - 99 mg/dL   Comment 1 Documented in Chart     Comment 2 Notify RN    GLUCOSE, CAPILLARY      Result Value Range   Glucose-Capillary 188 (*) 70 - 99 mg/dL  BASIC METABOLIC PANEL      Result Value Range   Sodium 141  135 - 145 mEq/L   Potassium 3.5  3.5 - 5.1 mEq/L   Chloride 97  96 - 112 mEq/L   CO2 35 (*) 19 - 32 mEq/L   Glucose, Bld 175 (*) 70 - 99 mg/dL   BUN 40 (*) 6 - 23 mg/dL   Creatinine, Ser 1.09  0.50 - 1.10 mg/dL   Calcium 60.4  8.4 - 54.0 mg/dL   GFR calc non Af Amer 58 (*) >90 mL/min   GFR calc Af Amer 68 (*) >90 mL/min  CBC WITH DIFFERENTIAL      Result Value Range   WBC 14.6 (*) 4.0 - 10.5 K/uL   RBC 4.38  3.87 - 5.11 MIL/uL   Hemoglobin 12.7  12.0 - 15.0 g/dL   HCT 16.1  09.6 - 04.5 %   MCV 89.3  78.0 - 100.0 fL   MCH 29.0  26.0 - 34.0 pg   MCHC 32.5  30.0 - 36.0 g/dL   RDW 40.9  81.1 - 91.4 %   Platelets 346  150 - 400 K/uL   Neutrophils Relative 82 (*) 43 - 77 %   Lymphocytes Relative 7 (*) 12 - 46 %   Monocytes Relative 10  3 - 12 %   Eosinophils Relative 0  0 - 5 %   Basophils Relative 1  0 - 1 %   Neutro Abs 12.0 (*) 1.7 - 7.7 K/uL   Lymphs Abs 1.0  0.7 - 4.0 K/uL    Monocytes Absolute 1.5 (*) 0.1 - 1.0 K/uL   Eosinophils Absolute 0.0  0.0 - 0.7 K/uL   Basophils Absolute 0.1  0.0 - 0.1 K/uL   RBC Morphology POLYCHROMASIA PRESENT     WBC Morphology MILD LEFT SHIFT (1-5% METAS, OCC MYELO, OCC BANDS)    HEPATIC FUNCTION PANEL      Result Value Range   Total Protein 6.9  6.0 - 8.3 g/dL   Albumin 3.1 (*) 3.5 - 5.2 g/dL   AST 81 (*) 0 - 37 U/L   ALT 68 (*) 0 - 35 U/L   Alkaline Phosphatase 111  39 - 117 U/L   Total Bilirubin 0.2 (*) 0.3 - 1.2 mg/dL   Bilirubin, Direct <7.8  0.0 - 0.3 mg/dL   Indirect Bilirubin NOT CALCULATED  0.3 - 0.9 mg/dL  GLUCOSE, CAPILLARY      Result Value Range   Glucose-Capillary 265 (*) 70 - 99 mg/dL  GLUCOSE, CAPILLARY      Result Value Range   Glucose-Capillary 157 (*) 70 - 99 mg/dL  GLUCOSE, CAPILLARY      Result Value Range   Glucose-Capillary 241 (*) 70 - 99 mg/dL  GLUCOSE, CAPILLARY      Result Value Range   Glucose-Capillary 212 (*) 70 - 99 mg/dL  GLUCOSE, CAPILLARY      Result Value Range   Glucose-Capillary 174 (*) 70 - 99 mg/dL  GLUCOSE, CAPILLARY      Result Value Range   Glucose-Capillary 168 (*) 70 - 99 mg/dL  GLUCOSE, CAPILLARY      Result Value Range   Glucose-Capillary 263 (*) 70 - 99 mg/dL   Comment 1 Notify RN    BASIC METABOLIC PANEL      Result Value Range   Sodium 142  135 - 145 mEq/L   Potassium 3.9  3.5 - 5.1 mEq/L   Chloride 100  96 - 112 mEq/L   CO2 36 (*) 19 - 32 mEq/L   Glucose, Bld 138 (*) 70 - 99 mg/dL   BUN 39 (*) 6 - 23 mg/dL   Creatinine, Ser 2.95  0.50 - 1.10 mg/dL   Calcium 9.6  8.4 - 62.1 mg/dL   GFR calc non Af Amer 75 (*) >90 mL/min   GFR calc Af Amer 87 (*) >90 mL/min  CBC      Result Value Range   WBC 13.1 (*) 4.0 - 10.5 K/uL   RBC 4.15  3.87 - 5.11 MIL/uL   Hemoglobin 12.1  12.0 - 15.0 g/dL   HCT 30.8  65.7 - 84.6 %   MCV 90.6  78.0 - 100.0 fL   MCH 29.2  26.0 - 34.0 pg   MCHC 32.2  30.0 - 36.0 g/dL  RDW 13.6  11.5 - 15.5 %   Platelets 361  150 - 400 K/uL   GLUCOSE, CAPILLARY      Result Value Range   Glucose-Capillary 285 (*) 70 - 99 mg/dL   Comment 1 Notify RN    GLUCOSE, CAPILLARY      Result Value Range   Glucose-Capillary 228 (*) 70 - 99 mg/dL  GLUCOSE, CAPILLARY      Result Value Range   Glucose-Capillary 135 (*) 70 - 99 mg/dL  GLUCOSE, CAPILLARY      Result Value Range   Glucose-Capillary 208 (*) 70 - 99 mg/dL  GLUCOSE, CAPILLARY      Result Value Range   Glucose-Capillary 247 (*) 70 - 99 mg/dL  GLUCOSE, CAPILLARY      Result Value Range   Glucose-Capillary 132 (*) 70 - 99 mg/dL   Comment 1 Documented in Chart     Comment 2 Notify RN    GLUCOSE, CAPILLARY      Result Value Range   Glucose-Capillary 105 (*) 70 - 99 mg/dL  GLUCOSE, CAPILLARY      Result Value Range   Glucose-Capillary 199 (*) 70 - 99 mg/dL   Comment 1 Notify RN     Comment 2 Documented in Chart    GLUCOSE, CAPILLARY      Result Value Range   Glucose-Capillary 282 (*) 70 - 99 mg/dL   Comment 1 Notify RN     Comment 2 Documented in Chart    GLUCOSE, CAPILLARY      Result Value Range   Glucose-Capillary 162 (*) 70 - 99 mg/dL   Comment 1 Documented in Chart     Comment 2 Notify RN    GLUCOSE, CAPILLARY      Result Value Range   Glucose-Capillary 98  70 - 99 mg/dL  POCT I-STAT 3, BLOOD GAS (G3+)      Result Value Range   pH, Arterial 7.294 (*) 7.350 - 7.450   pCO2 arterial 56.5 (*) 35.0 - 45.0 mmHg   pO2, Arterial 128.0 (*) 80.0 - 100.0 mmHg   Bicarbonate 27.4 (*) 20.0 - 24.0 mEq/L   TCO2 29  0 - 100 mmol/L   O2 Saturation 98.0     Patient temperature 98.6 F     Collection site RADIAL, ALLEN'S TEST ACCEPTABLE     Drawn by RT     Sample type ARTERIAL    CG4 I-STAT (LACTIC ACID)      Result Value Range   Lactic Acid, Venous 2.05  0.5 - 2.2 mmol/L    Time coordinating discharge: 40 minutes  Signed: Pearla Dubonnet, MD 06/28/2012, 8:06 AM

## 2012-06-28 NOTE — Progress Notes (Signed)
PT Cancellation Note  Patient Details Name: [name] MRN: [id number] DOB: [birth year]   Cancelled Treatment:     Pt with d/c summary. Spoke with CSW who reports she will d/c to SNF today. Will defer further therapies.    [facility] HELEN 06/28/2012, 11:54 AM Pager: [phone]

## 2012-06-28 NOTE — Significant Event (Signed)
Patient report called to Annenette Nurse at [facility] . Patient and belongings to be transferred via ambulance. Patient A&OX4 VSS. No distress or c/o noted Pt on 2-3 lit O2 via N/C.

## 2012-06-28 NOTE — Clinical Social Work Psychosocial (Addendum)
    Clinical Social Work Department BRIEF PSYCHOSOCIAL ASSESSMENT 06/28/2012  Patient:  [name], [name]     Account Number:  [id number]     Admit date:  06/21/2012  Clinical Social Worker:  Tiburcio Pea  Date/Time:  06/26/2012 04:30 PM  Referred by:  Physician  Date Referred:  06/26/2012 Referred for  SNF Placement   Other Referral:   Interview type:  Other - See comment Other interview type:   Patient and her daughter Byrd Hesselbach via phone    PSYCHOSOCIAL DATA Living Status:  ALONE Admitted from facility:   Level of care:   Primary support name:  Archie Balboa  (c) [phone] Primary support relationship to patient:  CHILD, ADULT Degree of support available:   Strong support    CURRENT CONCERNS Current Concerns  Post-Acute Placement   Other Concerns:    SOCIAL WORK ASSESSMENT / PLAN CSW met with patient to discuss need for short term SNF. She lives alone and has her daughter for support but she works full time.  Other support comes from a neighb and a close friend.  She wants to return home as soon as possible. She has never sought SNF placement before and is agreeable to full bed search in [facility]. She requests that her daughter be invovled as she works in [employer]. FL2 will be placed on chart for MD's signature; Bed search to be initated.  Above discussed with patient's daughter who agrees to same.   Assessment/plan status:  Psychosocial Support/Ongoing Assessment of Needs Other assessment/ plan:   Information/referral to community resources:   SNF bed list provided  Aftercare needs discussed- HH/DME to be arranged by SNF as indicated    PATIENT'S/FAMILY'S RESPONSE TO PLAN OF CARE: Patient is alert, oriented and extremely pleasant. She has a very positive attitude about placement and agrees to search. She wants her daughter Byrd Hesselbach invovled in placement process.  Bed search to be initiated.

## 2012-06-29 NOTE — Progress Notes (Signed)
Utilization Review Completed.   Kimberly Tucker, RN, BSN Nurse Case Manager  [phone]  

## 2013-08-06 ENCOUNTER — Emergency Department (HOSPITAL_COMMUNITY): Payer: Medicare Other

## 2013-08-06 ENCOUNTER — Encounter (HOSPITAL_COMMUNITY): Payer: Self-pay | Admitting: Emergency Medicine

## 2013-08-06 ENCOUNTER — Inpatient Hospital Stay (HOSPITAL_COMMUNITY)
Admission: EM | Admit: 2013-08-06 | Discharge: 2013-08-13 | DRG: 190 | Disposition: A | Discharge status: Home / Self Care | Payer: Medicare Other | Attending: Internal Medicine | Admitting: Internal Medicine

## 2013-08-06 DIAGNOSIS — I2510 Atherosclerotic heart disease of native coronary artery without angina pectoris: Secondary | ICD-10-CM | POA: Diagnosis present

## 2013-08-06 DIAGNOSIS — I252 Old myocardial infarction: Secondary | ICD-10-CM

## 2013-08-06 DIAGNOSIS — K5900 Constipation, unspecified: Secondary | ICD-10-CM | POA: Diagnosis present

## 2013-08-06 DIAGNOSIS — J9620 Acute and chronic respiratory failure, unspecified whether with hypoxia or hypercapnia: Secondary | ICD-10-CM

## 2013-08-06 DIAGNOSIS — K219 Gastro-esophageal reflux disease without esophagitis: Secondary | ICD-10-CM | POA: Diagnosis present

## 2013-08-06 DIAGNOSIS — E1165 Type 2 diabetes mellitus with hyperglycemia: Secondary | ICD-10-CM | POA: Diagnosis present

## 2013-08-06 DIAGNOSIS — Z515 Encounter for palliative care: Secondary | ICD-10-CM

## 2013-08-06 DIAGNOSIS — I10 Essential (primary) hypertension: Secondary | ICD-10-CM

## 2013-08-06 DIAGNOSIS — Z87891 Personal history of nicotine dependence: Secondary | ICD-10-CM

## 2013-08-06 DIAGNOSIS — F4321 Adjustment disorder with depressed mood: Secondary | ICD-10-CM | POA: Diagnosis present

## 2013-08-06 DIAGNOSIS — K649 Unspecified hemorrhoids: Secondary | ICD-10-CM | POA: Diagnosis present

## 2013-08-06 DIAGNOSIS — Z6829 Body mass index (BMI) 29.0-29.9, adult: Secondary | ICD-10-CM

## 2013-08-06 DIAGNOSIS — D649 Anemia, unspecified: Secondary | ICD-10-CM | POA: Diagnosis present

## 2013-08-06 DIAGNOSIS — E785 Hyperlipidemia, unspecified: Secondary | ICD-10-CM

## 2013-08-06 DIAGNOSIS — D72829 Elevated white blood cell count, unspecified: Secondary | ICD-10-CM

## 2013-08-06 DIAGNOSIS — Z853 Personal history of malignant neoplasm of breast: Secondary | ICD-10-CM

## 2013-08-06 DIAGNOSIS — M129 Arthropathy, unspecified: Secondary | ICD-10-CM | POA: Diagnosis present

## 2013-08-06 DIAGNOSIS — Z7982 Long term (current) use of aspirin: Secondary | ICD-10-CM

## 2013-08-06 DIAGNOSIS — F411 Generalized anxiety disorder: Secondary | ICD-10-CM

## 2013-08-06 DIAGNOSIS — Z66 Do not resuscitate: Secondary | ICD-10-CM | POA: Diagnosis present

## 2013-08-06 DIAGNOSIS — Z9981 Dependence on supplemental oxygen: Secondary | ICD-10-CM

## 2013-08-06 DIAGNOSIS — E1149 Type 2 diabetes mellitus with other diabetic neurological complication: Secondary | ICD-10-CM | POA: Diagnosis present

## 2013-08-06 DIAGNOSIS — J449 Chronic obstructive pulmonary disease, unspecified: Secondary | ICD-10-CM

## 2013-08-06 DIAGNOSIS — E860 Dehydration: Secondary | ICD-10-CM | POA: Diagnosis present

## 2013-08-06 DIAGNOSIS — IMO0002 Reserved for concepts with insufficient information to code with codable children: Secondary | ICD-10-CM | POA: Diagnosis present

## 2013-08-06 DIAGNOSIS — Z87718 Personal history of other specified (corrected) congenital malformations of genitourinary system: Secondary | ICD-10-CM

## 2013-08-06 DIAGNOSIS — E1142 Type 2 diabetes mellitus with diabetic polyneuropathy: Secondary | ICD-10-CM | POA: Diagnosis present

## 2013-08-06 DIAGNOSIS — G2581 Restless legs syndrome: Secondary | ICD-10-CM | POA: Diagnosis present

## 2013-08-06 DIAGNOSIS — J441 Chronic obstructive pulmonary disease with (acute) exacerbation: Principal | ICD-10-CM

## 2013-08-06 HISTORY — DX: Major depressive disorder, single episode, unspecified: F32.9

## 2013-08-06 HISTORY — DX: Myoneural disorder, unspecified: G70.9

## 2013-08-06 HISTORY — DX: Gastro-esophageal reflux disease without esophagitis: K21.9

## 2013-08-06 HISTORY — DX: Unspecified osteoarthritis, unspecified site: M19.90

## 2013-08-06 HISTORY — DX: Depression, unspecified: F32.A

## 2013-08-06 LAB — CBC
HEMATOCRIT: 35.9 % — AB (ref 36.0–46.0)
HEMATOCRIT: 32.8 % — AB (ref 36.0–46.0)
HEMOGLOBIN: 11.1 g/dL — AB (ref 12.0–15.0)
Hemoglobin: 10 g/dL — ABNORMAL LOW (ref 12.0–15.0)
MCH: 28.3 pg (ref 26.0–34.0)
MCH: 27.9 pg (ref 26.0–34.0)
MCHC: 30.9 g/dL (ref 30.0–36.0)
MCHC: 30.5 g/dL (ref 30.0–36.0)
MCV: 91.6 fL (ref 78.0–100.0)
MCV: 91.4 fL (ref 78.0–100.0)
PLATELETS: 229 10*3/uL (ref 150–400)
Platelets: 246 10*3/uL (ref 150–400)
RBC: 3.92 MIL/uL (ref 3.87–5.11)
RBC: 3.59 MIL/uL — ABNORMAL LOW (ref 3.87–5.11)
RDW: 14.9 % (ref 11.5–15.5)
RDW: 14.9 % (ref 11.5–15.5)
WBC: 12.2 10*3/uL — ABNORMAL HIGH (ref 4.0–10.5)
WBC: 9.7 10*3/uL (ref 4.0–10.5)

## 2013-08-06 LAB — BASIC METABOLIC PANEL
BUN: 39 mg/dL — ABNORMAL HIGH (ref 6–23)
CALCIUM: 10.8 mg/dL — AB (ref 8.4–10.5)
CHLORIDE: 97 meq/L (ref 96–112)
CO2: 30 meq/L (ref 19–32)
Creatinine, Ser: 1.24 mg/dL — ABNORMAL HIGH (ref 0.50–1.10)
GFR calc Af Amer: 45 mL/min — ABNORMAL LOW (ref >90)
GFR calc non Af Amer: 39 mL/min — ABNORMAL LOW (ref >90)
GLUCOSE: 208 mg/dL — AB (ref 70–99)
POTASSIUM: 4.6 meq/L (ref 3.7–5.3)
SODIUM: 143 meq/L (ref 137–147)

## 2013-08-06 LAB — CREATININE, SERUM
Creatinine, Ser: 1.09 mg/dL (ref 0.50–1.10)
GFR calc Af Amer: 52 mL/min — ABNORMAL LOW (ref >90)
GFR, EST NON AFRICAN AMERICAN: 45 mL/min — AB (ref >90)

## 2013-08-06 LAB — I-STAT TROPONIN, ED: Troponin i, poc: 0.02 ng/mL (ref 0.00–0.08)

## 2013-08-06 LAB — PRO B NATRIURETIC PEPTIDE: PRO B NATRI PEPTIDE: 135.5 pg/mL (ref 0–450)

## 2013-08-06 LAB — EXPECTORATED SPUTUM ASSESSMENT W REFEX TO RESP CULTURE

## 2013-08-06 LAB — INFLUENZA PANEL BY PCR (TYPE A & B)
H1N1FLUPCR: NOT DETECTED
INFLAPCR: NEGATIVE
INFLBPCR: NEGATIVE

## 2013-08-06 LAB — MRSA PCR SCREENING: MRSA by PCR: NEGATIVE

## 2013-08-06 LAB — EXPECTORATED SPUTUM ASSESSMENT W GRAM STAIN, RFLX TO RESP C: Special Requests: NORMAL

## 2013-08-06 LAB — GLUCOSE, CAPILLARY: GLUCOSE-CAPILLARY: 433 mg/dL — AB (ref 70–99)

## 2013-08-06 MED ORDER — METHYLPREDNISOLONE SODIUM SUCC 125 MG IJ SOLR
125.0000 mg | Freq: Once | INTRAMUSCULAR | Status: AC
  Administered 2013-08-06: 125 mg via INTRAVENOUS
  Filled 2013-08-06: qty 2

## 2013-08-06 MED ORDER — ALBUTEROL (5 MG/ML) CONTINUOUS INHALATION SOLN
10.0000 mg/h | INHALATION_SOLUTION | RESPIRATORY_TRACT | Status: AC
  Administered 2013-08-06: 10 mg/h via RESPIRATORY_TRACT
  Filled 2013-08-06: qty 20

## 2013-08-06 MED ORDER — SODIUM CHLORIDE 0.9 % IV SOLN
INTRAVENOUS | Status: DC
  Administered 2013-08-06 – 2013-08-12 (×4): via INTRAVENOUS

## 2013-08-06 MED ORDER — VITAMIN D 1000 UNITS PO TABS
2000.0000 [IU] | ORAL_TABLET | Freq: Every day | ORAL | Status: DC
  Administered 2013-08-07 – 2013-08-13 (×7): 2000 [IU] via ORAL
  Filled 2013-08-06 (×7): qty 2

## 2013-08-06 MED ORDER — FLUTICASONE PROPIONATE HFA 44 MCG/ACT IN AERO
2.0000 | INHALATION_SPRAY | Freq: Two times a day (BID) | RESPIRATORY_TRACT | Status: DC
  Administered 2013-08-07 – 2013-08-08 (×4): 2 via RESPIRATORY_TRACT
  Filled 2013-08-06 (×2): qty 10.6

## 2013-08-06 MED ORDER — HYDROCHLOROTHIAZIDE 25 MG PO TABS
25.0000 mg | ORAL_TABLET | Freq: Every day | ORAL | Status: DC
  Administered 2013-08-07 – 2013-08-13 (×7): 25 mg via ORAL
  Filled 2013-08-06 (×7): qty 1

## 2013-08-06 MED ORDER — INSULIN ASPART 100 UNIT/ML ~~LOC~~ SOLN
0.0000 [IU] | Freq: Every day | SUBCUTANEOUS | Status: DC
  Administered 2013-08-07 – 2013-08-11 (×5): 3 [IU] via SUBCUTANEOUS
  Administered 2013-08-12: 22:00:00 via SUBCUTANEOUS

## 2013-08-06 MED ORDER — LEVOFLOXACIN IN D5W 750 MG/150ML IV SOLN
750.0000 mg | INTRAVENOUS | Status: DC
  Administered 2013-08-06 – 2013-08-12 (×4): 750 mg via INTRAVENOUS
  Filled 2013-08-06 (×5): qty 150

## 2013-08-06 MED ORDER — CITALOPRAM HYDROBROMIDE 20 MG PO TABS
20.0000 mg | ORAL_TABLET | Freq: Every day | ORAL | Status: DC
  Administered 2013-08-07 – 2013-08-13 (×7): 20 mg via ORAL
  Filled 2013-08-06 (×7): qty 1

## 2013-08-06 MED ORDER — ACETAMINOPHEN 650 MG RE SUPP: 650.0000 mg | Freq: Four times a day (QID) | RECTAL | Status: DC | PRN

## 2013-08-06 MED ORDER — IBUPROFEN 200 MG PO TABS
200.0000 mg | ORAL_TABLET | Freq: Four times a day (QID) | ORAL | Status: DC | PRN
  Filled 2013-08-06: qty 2

## 2013-08-06 MED ORDER — ACETAMINOPHEN 325 MG PO TABS
650.0000 mg | ORAL_TABLET | Freq: Four times a day (QID) | ORAL | Status: DC | PRN
  Administered 2013-08-06: 650 mg via ORAL
  Filled 2013-08-06: qty 2

## 2013-08-06 MED ORDER — HYDROMORPHONE HCL PF 1 MG/ML IJ SOLN: 0.5000 mg | INTRAMUSCULAR | Status: DC | PRN

## 2013-08-06 MED ORDER — ACETAMINOPHEN 325 MG PO TABS: 650.0000 mg | ORAL_TABLET | Freq: Four times a day (QID) | ORAL | Status: DC | PRN

## 2013-08-06 MED ORDER — LORATADINE 10 MG PO TABS
10.0000 mg | ORAL_TABLET | Freq: Every day | ORAL | Status: DC
  Administered 2013-08-07 – 2013-08-13 (×7): 10 mg via ORAL
  Filled 2013-08-06 (×7): qty 1

## 2013-08-06 MED ORDER — ASPIRIN 325 MG PO TABS
325.0000 mg | ORAL_TABLET | Freq: Every day | ORAL | Status: DC
  Administered 2013-08-07 – 2013-08-13 (×7): 325 mg via ORAL
  Filled 2013-08-06 (×7): qty 1

## 2013-08-06 MED ORDER — IRBESARTAN 150 MG PO TABS
150.0000 mg | ORAL_TABLET | Freq: Every day | ORAL | Status: DC
  Administered 2013-08-07 – 2013-08-13 (×7): 150 mg via ORAL
  Filled 2013-08-06 (×7): qty 1

## 2013-08-06 MED ORDER — BIOTENE DRY MOUTH MT LIQD
15.0000 mL | Freq: Two times a day (BID) | OROMUCOSAL | Status: DC
  Administered 2013-08-07 – 2013-08-12 (×11): 15 mL via OROMUCOSAL

## 2013-08-06 MED ORDER — SODIUM CHLORIDE 0.9 % IV BOLUS (SEPSIS)
500.0000 mL | Freq: Once | INTRAVENOUS | Status: AC
  Administered 2013-08-06: 500 mL via INTRAVENOUS

## 2013-08-06 MED ORDER — LEVALBUTEROL HCL 0.63 MG/3ML IN NEBU
0.6300 mg | INHALATION_SOLUTION | RESPIRATORY_TRACT | Status: DC | PRN
  Administered 2013-08-09: 0.63 mg via RESPIRATORY_TRACT

## 2013-08-06 MED ORDER — SODIUM CHLORIDE 0.9 % IV SOLN
Freq: Once | INTRAVENOUS | Status: AC
  Administered 2013-08-06: 100 mL/h via INTRAVENOUS

## 2013-08-06 MED ORDER — ONDANSETRON HCL 4 MG PO TABS: 4.0000 mg | ORAL_TABLET | Freq: Four times a day (QID) | ORAL | Status: DC | PRN

## 2013-08-06 MED ORDER — METHYLPREDNISOLONE SODIUM SUCC 125 MG IJ SOLR
60.0000 mg | Freq: Four times a day (QID) | INTRAMUSCULAR | Status: DC
  Administered 2013-08-06 – 2013-08-07 (×4): 60 mg via INTRAVENOUS
  Filled 2013-08-06 (×7): qty 0.96

## 2013-08-06 MED ORDER — INSULIN ASPART 100 UNIT/ML ~~LOC~~ SOLN
0.0000 [IU] | Freq: Three times a day (TID) | SUBCUTANEOUS | Status: DC
  Administered 2013-08-07: 3 [IU] via SUBCUTANEOUS
  Administered 2013-08-07: 9 [IU] via SUBCUTANEOUS
  Administered 2013-08-07: 3 [IU] via SUBCUTANEOUS
  Administered 2013-08-08: 5 [IU] via SUBCUTANEOUS
  Administered 2013-08-08 (×2): 3 [IU] via SUBCUTANEOUS
  Administered 2013-08-09 (×2): 5 [IU] via SUBCUTANEOUS
  Administered 2013-08-09 – 2013-08-10 (×2): 3 [IU] via SUBCUTANEOUS

## 2013-08-06 MED ORDER — SIMVASTATIN 40 MG PO TABS
40.0000 mg | ORAL_TABLET | Freq: Every evening | ORAL | Status: DC
  Administered 2013-08-07: 40 mg via ORAL
  Filled 2013-08-06 (×2): qty 1

## 2013-08-06 MED ORDER — MOMETASONE FURO-FORMOTEROL FUM 100-5 MCG/ACT IN AERO
2.0000 | INHALATION_SPRAY | Freq: Two times a day (BID) | RESPIRATORY_TRACT | Status: DC
Start: 2013-08-06 — End: 2013-08-13
  Administered 2013-08-06 – 2013-08-13 (×14): 2 via RESPIRATORY_TRACT
  Filled 2013-08-06: qty 8.8

## 2013-08-06 MED ORDER — ONDANSETRON HCL 4 MG/2ML IJ SOLN: 4.0000 mg | Freq: Four times a day (QID) | INTRAMUSCULAR | Status: DC | PRN

## 2013-08-06 MED ORDER — ENOXAPARIN SODIUM 40 MG/0.4ML ~~LOC~~ SOLN
40.0000 mg | SUBCUTANEOUS | Status: DC
  Administered 2013-08-06 – 2013-08-12 (×7): 40 mg via SUBCUTANEOUS
  Filled 2013-08-06 (×8): qty 0.4

## 2013-08-06 MED ORDER — PRAMIPEXOLE DIHYDROCHLORIDE 0.125 MG PO TABS
0.1250 mg | ORAL_TABLET | Freq: Every day | ORAL | Status: DC
  Administered 2013-08-06: 0.25 mg via ORAL
  Administered 2013-08-07: 0.125 mg via ORAL
  Filled 2013-08-06 (×3): qty 2

## 2013-08-06 MED ORDER — ALBUTEROL SULFATE (2.5 MG/3ML) 0.083% IN NEBU
5.0000 mg | INHALATION_SOLUTION | Freq: Once | RESPIRATORY_TRACT | Status: AC
  Administered 2013-08-06: 5 mg via RESPIRATORY_TRACT
  Filled 2013-08-06: qty 6

## 2013-08-06 MED ORDER — GUAIFENESIN ER 600 MG PO TB12
600.0000 mg | ORAL_TABLET | Freq: Two times a day (BID) | ORAL | Status: DC
  Administered 2013-08-06 – 2013-08-13 (×14): 600 mg via ORAL
  Filled 2013-08-06 (×15): qty 1

## 2013-08-06 MED ORDER — VALSARTAN-HYDROCHLOROTHIAZIDE 160-25 MG PO TABS: 1.0000 | ORAL_TABLET | Freq: Every day | ORAL | Status: DC

## 2013-08-06 MED ORDER — TEMAZEPAM 15 MG PO CAPS
15.0000 mg | ORAL_CAPSULE | Freq: Every evening | ORAL | Status: DC | PRN
  Administered 2013-08-06 – 2013-08-12 (×7): 15 mg via ORAL
  Filled 2013-08-06 (×7): qty 1

## 2013-08-06 MED ORDER — LEVALBUTEROL HCL 0.63 MG/3ML IN NEBU
0.6300 mg | INHALATION_SOLUTION | RESPIRATORY_TRACT | Status: DC
Start: 2013-08-06 — End: 2013-08-10
  Administered 2013-08-06 – 2013-08-09 (×17): 0.63 mg via RESPIRATORY_TRACT
  Filled 2013-08-06 (×38): qty 3

## 2013-08-06 MED ORDER — IPRATROPIUM BROMIDE 0.02 % IN SOLN
0.5000 mg | RESPIRATORY_TRACT | Status: DC
  Administered 2013-08-06 – 2013-08-09 (×17): 0.5 mg via RESPIRATORY_TRACT
  Filled 2013-08-06 (×20): qty 2.5

## 2013-08-06 MED ORDER — GUAIFENESIN-DM 100-10 MG/5ML PO SYRP
5.0000 mL | ORAL_SOLUTION | ORAL | Status: DC | PRN
  Filled 2013-08-06: qty 5

## 2013-08-06 MED ORDER — BENZONATATE 100 MG PO CAPS
100.0000 mg | ORAL_CAPSULE | Freq: Three times a day (TID) | ORAL | Status: DC
  Administered 2013-08-06 – 2013-08-13 (×20): 100 mg via ORAL
  Filled 2013-08-06 (×24): qty 1

## 2013-08-06 MED ORDER — POLYETHYLENE GLYCOL 3350 17 G PO PACK
17.0000 g | PACK | Freq: Every day | ORAL | Status: DC
  Administered 2013-08-06 – 2013-08-13 (×7): 17 g via ORAL
  Filled 2013-08-06 (×8): qty 1

## 2013-08-06 MED ORDER — INSULIN ASPART 100 UNIT/ML ~~LOC~~ SOLN
7.0000 [IU] | Freq: Once | SUBCUTANEOUS | Status: AC
  Administered 2013-08-06: 7 [IU] via SUBCUTANEOUS

## 2013-08-06 NOTE — Progress Notes (Signed)
Patient arrived to unit accompanied by transportation.  Patient alert, oriented, verbally responsive, breathing regular, dyspnea on exertion, no s/s of distress noted throughout, no c/o pain throughout.  Patient oriented to unit and room, needs attended to.  VS WNL.  Will continue to monitor.  Dirk Dress 08/06/2013

## 2013-08-06 NOTE — Progress Notes (Signed)
Place in bed with telemetry box 18, ST with PVC, & O2 2L, no c/o pain.  Also placed on contact isolation for hx. of MRSA & droplet isolation to r/o flu.  Will continue to monitor.  Alphonzo Lemmings, RN

## 2013-08-06 NOTE — ED Provider Notes (Signed)
Medical screening examination/treatment/procedure(s) were conducted as a shared visit with non-physician practitioner(s) and myself.  I personally evaluated the patient during the encounter.   EKG Interpretation None      Date: 08/06/2013  Rate: 102  Rhythm: sinus tachycardia  QRS Axis: normal  Intervals: normal  ST/T Wave abnormalities: nonspecific ST changes  Conduction Disutrbances:none  Narrative Interpretation:   Old EKG Reviewed: none available  Patient here short of breath x3 days. Similar to her COPD exacerbations. Patient given Solu-Medrol and albuterol here and will require admission.    Leota Jacobsen, MD 08/06/13 [phone]

## 2013-08-06 NOTE — ED Provider Notes (Signed)
CSN: [id number]     Arrival date & time 08/06/13  1226 History   First MD Initiated Contact with Patient 08/06/13 1413     Chief Complaint  Patient presents with  . Shortness of Breath     (Consider location/radiation/quality/duration/timing/severity/associated sxs/prior Treatment) HPI 80 yo female with hx of COPD presents with worsening shortness of breath over the past couple weeks and over the past 3 days much worse. Patient admits to productive cough and wheezing. Has taking OTC cold medications in addition to her daily inhalers with out any relief. Patient admit to "lung pain", worse when she takes deep breaths. Denies any legs swelling, weight gain, or palpitations. Patient is a former smoker with PMH significant for DM, HTN, breast cancer and NSTEMI. Patient admits to home O2 at 2L/min. Past Medical History  Diagnosis Date  . COPD (chronic obstructive pulmonary disease)   . Diabetes mellitus without complication   . Hypertension   . Breast cancer treated with RT  . Shortness of breath   . 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 NSTEMI (non-ST elevated myocardial infarction)   . Neuromuscular disorder     DIABETIC NEUROPATHY  . Depression   . GERD (gastroesophageal reflux disease)   . Arthritis     NECK   Past Surgical History  Procedure Laterality Date  . Back surgery    . Vulvectomy partial    . Cholecystectomy    . Breast lumpectomy Left    History reviewed. No pertinent family history. History  Substance Use Topics  . Smoking status: Former Smoker    Types: Cigarettes    Quit date: 04/19/1996  . Smokeless tobacco: Never Used  . Alcohol Use: No   OB History   Grav Para Term Preterm Abortions TAB SAB Ect Mult Living                 Review of Systems  All other systems reviewed and are negative.     Allergies  Review of patient's allergies indicates no known allergies.  Home Medications   Prior to Admission medications    Medication Sig Start Date End Date Taking? Authorizing Provider  acetaminophen (TYLENOL) 325 MG tablet Take 2 tablets (650 mg total) by mouth every 6 (six) hours as needed for pain or fever. 06/28/12  Yes Josetta Huddle, MD  albuterol (PROVENTIL) (2.5 MG/3ML) 0.083% nebulizer solution Take 2.5 mg by nebulization every 6 (six) hours as needed for wheezing or shortness of breath.   Yes Historical Provider, MD  aspirin 325 MG tablet Take 325 mg by mouth daily.   Yes Historical Provider, MD  budesonide (PULMICORT) 180 MCG/ACT inhaler Inhale 2 puffs into the lungs 2 (two) times daily.   Yes Historical Provider, MD  Cholecalciferol (VITAMIN D) 2000 UNITS CAPS Take 1 capsule by mouth daily.   Yes Historical Provider, MD  citalopram (CELEXA) 20 MG tablet Take 20 mg by mouth daily.  05/22/13  Yes Historical Provider, MD  doxepin (SINEQUAN) 10 MG capsule Take 10 mg by mouth.   Yes Historical Provider, MD  Fluticasone-Salmeterol (ADVAIR DISKUS) 250-50 MCG/DOSE AEPB Inhale 1 puff into the lungs every 12 (twelve) hours.   Yes Historical Provider, MD  guaiFENesin (MUCINEX) 600 MG 12 hr tablet Take 600 mg by mouth 2 (two) times daily.   Yes Historical Provider, MD  Ibuprofen (ADVIL PO) Take 2 tablets by mouth every 4 (four) hours as needed (pain).   Yes  Historical Provider, MD  loratadine (CLARITIN) 10 MG tablet Take 10 mg by mouth daily.   Yes Historical Provider, MD  metFORMIN (GLUCOPHAGE) 500 MG tablet Take 500 mg by mouth 2 (two) times daily with a meal.   Yes Historical Provider, MD  polyethylene glycol (MIRALAX / GLYCOLAX) packet Take 17 g by mouth daily. 06/28/12  Yes Josetta Huddle, MD  pramipexole (MIRAPEX) 0.125 MG tablet Take 0.125-0.25 mg by mouth at bedtime.  06/27/13  Yes Historical Provider, MD  simvastatin (ZOCOR) 40 MG tablet Take 40 mg by mouth every evening.   Yes Historical Provider, MD  temazepam (RESTORIL) 15 MG capsule Take 1 capsule (15 mg total) by mouth at bedtime and may repeat dose one time if  needed. 06/28/12  Yes Josetta Huddle, MD  valsartan-hydrochlorothiazide (DIOVAN-HCT) 160-25 MG per tablet Take 1 tablet by mouth daily.  08/03/13  Yes Historical Provider, MD   BP 123/61  Pulse 88  Temp(Src) 98 F (36.7 C) (Oral)  Resp 20  Ht 5\' 4"  (1.626 m)  Wt 171 lb 8 oz (77.792 kg)  BMI 29.42 kg/m2  SpO2 92% Physical Exam  Nursing note and vitals reviewed. Constitutional: She is oriented to person, place, and time. She appears well-developed and well-nourished.  HENT:  Head: Normocephalic and atraumatic.  Nose: Nose normal. Right sinus exhibits no maxillary sinus tenderness and no frontal sinus tenderness. Left sinus exhibits no maxillary sinus tenderness and no frontal sinus tenderness.  Mouth/Throat: Uvula is midline, oropharynx is clear and moist and mucous membranes are normal. No oropharyngeal exudate, posterior oropharyngeal edema or posterior oropharyngeal erythema.  Eyes: Conjunctivae and EOM are normal. Pupils are equal, round, and reactive to light. Right eye exhibits no discharge. Left eye exhibits no discharge. No scleral icterus.  Neck: Normal range of motion and phonation normal. Neck supple. No JVD present. No rigidity. No tracheal deviation, no edema and no erythema present.  Cardiovascular: Regular rhythm.  Tachycardia present.  Exam reveals no gallop and no friction rub.   No murmur heard. Pulmonary/Chest: No stridor. She is in respiratory distress. She has decreased breath sounds. She has wheezes. She has no rhonchi. She has no rales.  Patient is in mild to moderate respiratory distress with noted labored breathing. Patient speaking in full sentences but appears to have some difficulty. Patient appears in good spirits, laughing and joking.  Patient mildly diaphoretic. No evidence of cyanosis.   Abdominal: Soft. She exhibits no distension. There is no tenderness.  Musculoskeletal: Normal range of motion. She exhibits no edema.  Lymphadenopathy:    She has no cervical  adenopathy.  Neurological: She is alert and oriented to person, place, and time.  Skin: Skin is warm. She is diaphoretic.  Psychiatric: She has a normal mood and affect. Her behavior is normal.    ED Course  Procedures (including critical care time) Labs Review Labs Reviewed  BASIC METABOLIC PANEL - Abnormal; Notable for the following:    Glucose, Bld 208 (*)    BUN 39 (*)    Creatinine, Ser 1.24 (*)    Calcium 10.8 (*)    GFR calc non Af Amer 39 (*)    GFR calc Af Amer 45 (*)    All other components within normal limits  CBC - Abnormal; Notable for the following:    WBC 12.2 (*)    Hemoglobin 11.1 (*)    HCT 35.9 (*)    All other components within normal limits  HEMOGLOBIN A1C - Abnormal; Notable for the  following:    Hemoglobin A1C 7.6 (*)    Mean Plasma Glucose 171 (*)    All other components within normal limits  CBC - Abnormal; Notable for the following:    RBC 3.59 (*)    Hemoglobin 10.0 (*)    HCT 32.8 (*)    All other components within normal limits  CREATININE, SERUM - Abnormal; Notable for the following:    GFR calc non Af Amer 45 (*)    GFR calc Af Amer 52 (*)    All other components within normal limits  GLUCOSE, CAPILLARY - Abnormal; Notable for the following:    Glucose-Capillary 433 (*)    All other components within normal limits  GLUCOSE, CAPILLARY - Abnormal; Notable for the following:    Glucose-Capillary 275 (*)    All other components within normal limits  CULTURE, EXPECTORATED SPUTUM-ASSESSMENT  MRSA PCR SCREENING  CULTURE, RESPIRATORY (NON-EXPECTORATED)  PRO B NATRIURETIC PEPTIDE  INFLUENZA PANEL BY PCR (TYPE A & B, O0B5)  BASIC METABOLIC PANEL  CBC  I-STAT TROPOININ, ED    Imaging Review Dg Chest 2 View (if Patient Has Fever And/or Copd)  08/06/2013   CLINICAL DATA:  Cough and congestion, 2-3 days duration.  Wheezing.  EXAM: CHEST  2 VIEW  COMPARISON:  06/28/2012  FINDINGS: Heart size is normal. There is calcification of the thoracic  aorta. There is central bronchial thickening but no evidence of infiltrate, collapse or fusion. No significant bony finding.  IMPRESSION: Bronchial thickening without consolidation or collapse.   Electronically Signed   By: Nelson Chimes M.D.   On: 08/06/2013 14:24     EKG Interpretation None      MDM   Final diagnoses:  COPD (chronic obstructive pulmonary disease)   Patient afebrile with stable VS.  CXR shows bronchial thickening w/o evidence of pneumonia.  Troponin negative. Low suspicion for ACS, Will get delta trop BNP wnl. Doubt CHF Renal insufficiency noted, consistent with Dehydration.  Leukocytosis at 12.2  Mild anemia w/ hgb of 11.1.  On exam patient exhibits diffuse wheezes with poor air movement, worse in lower lobes. Likely due to COPD exacerbation. Discussed need for patient to stay in hospital for tx. Patient and familiy agree with this plan. Patient started on continuous neb, IV solumedrol 125 mg, and IV fluids. Discussed patient with Dr. Zenia Resides. Plan to admit patient for further management.        Dossie Arbour [place], PA-C 08/07/13 [phone]

## 2013-08-06 NOTE — ED Notes (Signed)
Called Respiratory for treatment.

## 2013-08-06 NOTE — H&P (Signed)
History and Physical       Hospital Admission Note Date: 08/06/2013  Patient name: [name] Medical record number: [id number] Date of birth: 1927/10/21 Age: 80 y.o. Gender: female  PCP: Henrine Screws, MD    Chief Complaint:  Shortness of breath with wheezing for last 3 days  HPI: Patient is 81 year old female with history of chronic respiratory failure on 2 L O2 via nasal cannula at home, COPD, hypertension, CAD presented to the ER with shortness of breath and wheezing that has worsened in the last 3 days. History was obtained from the patient and her daughter in the room. Patient reports that she had seen her PCP a week and half ago as she was feeling generalized weakness and shortness of breath. Patient reports that nothing was found wrong with her and all her blood work came back fine. However in the last 3 days she noticed becoming short-winded with a few steps of ambulation, wheezing and cough. She also endorses having productive cough with thick phlegm but no fevers or chills. She has been using nebulizer breathing treatments about every 4 hours at home.   Review of Systems:  Constitutional: Denies fever, chills, diaphoresis, ++poor appetite and fatigue.  HEENT: Denies photophobia, eye pain, redness, hearing loss, ear pain, sore throat, rhinorrhea, sneezing, mouth sores, trouble swallowing, neck pain, neck stiffness and tinnitus. ++  feels congested Respiratory: Please see history of present illness.   Cardiovascular: Denies chest pain, palpitations and leg swelling.  Gastrointestinal: Denies nausea, vomiting, abdominal pain, diarrhea, constipation, blood in stool and abdominal distention.  Genitourinary: Denies dysuria, urgency, frequency, hematuria, flank pain and difficulty urinating.  Musculoskeletal: Denies myalgias, back pain, joint swelling, arthralgias and gait problem.  Skin: Denies pallor, rash and wound.   Neurological: Denies dizziness, seizures, syncope, weakness, light-headedness, numbness and headaches.  Hematological: Denies adenopathy. Easy bruising, personal or family bleeding history  Psychiatric/Behavioral: Denies suicidal ideation, mood changes, confusion, nervousness, sleep disturbance and agitation  Past Medical History: Past Medical History  Diagnosis Date  . COPD (chronic obstructive pulmonary disease)   . Diabetes mellitus without complication   . Hypertension   . Breast cancer treated with RT  . Shortness of breath   . 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 NSTEMI (non-ST elevated myocardial infarction)    Past Surgical History  Procedure Laterality Date  . Back surgery    . Vulvectomy partial    . Cholecystectomy    . Breast lumpectomy Left     Medications: Prior to Admission medications   Medication Sig Start Date End Date Taking? Authorizing Provider  acetaminophen (TYLENOL) 325 MG tablet Take 2 tablets (650 mg total) by mouth every 6 (six) hours as needed for pain or fever. 06/28/12  Yes Josetta Huddle, MD  albuterol (PROVENTIL) (2.5 MG/3ML) 0.083% nebulizer solution Take 2.5 mg by nebulization every 6 (six) hours as needed for wheezing or shortness of breath.   Yes Historical Provider, MD  aspirin 325 MG tablet Take 325 mg by mouth daily.   Yes Historical Provider, MD  budesonide (PULMICORT) 180 MCG/ACT inhaler Inhale 2 puffs into the lungs 2 (two) times daily.   Yes Historical Provider, MD  Cholecalciferol (VITAMIN D) 2000 UNITS CAPS Take 1 capsule by mouth daily.   Yes Historical Provider, MD  citalopram (CELEXA) 20 MG tablet Take 20 mg by mouth daily.  05/22/13  Yes Historical Provider, MD  doxepin (SINEQUAN) 10 MG capsule Take 10  mg by mouth.   Yes Historical Provider, MD  Fluticasone-Salmeterol (ADVAIR DISKUS) 250-50 MCG/DOSE AEPB Inhale 1 puff into the lungs every 12 (twelve) hours.   Yes Historical Provider, MD  guaiFENesin  (MUCINEX) 600 MG 12 hr tablet Take 600 mg by mouth 2 (two) times daily.   Yes Historical Provider, MD  Ibuprofen (ADVIL PO) Take 2 tablets by mouth every 4 (four) hours as needed (pain).   Yes Historical Provider, MD  loratadine (CLARITIN) 10 MG tablet Take 10 mg by mouth daily.   Yes Historical Provider, MD  metFORMIN (GLUCOPHAGE) 500 MG tablet Take 500 mg by mouth 2 (two) times daily with a meal.   Yes Historical Provider, MD  polyethylene glycol (MIRALAX / GLYCOLAX) packet Take 17 g by mouth daily. 06/28/12  Yes Josetta Huddle, MD  pramipexole (MIRAPEX) 0.125 MG tablet Take 0.125-0.25 mg by mouth at bedtime.  06/27/13  Yes Historical Provider, MD  simvastatin (ZOCOR) 40 MG tablet Take 40 mg by mouth every evening.   Yes Historical Provider, MD  temazepam (RESTORIL) 15 MG capsule Take 1 capsule (15 mg total) by mouth at bedtime and may repeat dose one time if needed. 06/28/12  Yes Josetta Huddle, MD  valsartan-hydrochlorothiazide (DIOVAN-HCT) 160-25 MG per tablet Take 1 tablet by mouth daily.  08/03/13  Yes Historical Provider, MD    Allergies:  No Known Allergies  Social History:  reports that she quit smoking about 17 years ago. Her smoking use included Cigarettes. She smoked 0.00 packs per day. She has never used smokeless tobacco. She reports that she does not drink alcohol or use illicit drugs. she lives at home by herself and ambulates with a cane  Family History: No family history on file.  Physical Exam: Blood pressure 114/84, pulse 106, temperature 97.8 F (36.6 C), temperature source Oral, resp. rate 22, height 5\' 4"  (1.626 m), weight 75.751 kg (167 lb), SpO2 98.00%. General: Alert, awake, oriented x3, in no acute distress. HEENT: normocephalic, atraumatic, anicteric sclera, pink conjunctiva, pupils equal and reactive to light and accomodation, oropharynx clear Neck: supple, no masses or lymphadenopathy, no goiter, no bruits  Heart: Tachycardia, Regular rate and rhythm, without murmurs,  rubs or gallops. Lungs: Diffuse expiratory wheezing bilaterally Abdomen: Soft, nontender, nondistended, positive bowel sounds, no masses. Extremities: No clubbing, cyanosis or edema with positive pedal pulses. Neuro: Grossly intact, no focal neurological deficits, strength 5/5 upper and lower extremities bilaterally Psych: alert and oriented x 3, normal mood and affect Skin: no rashes or lesions, warm and dry   LABS on Admission:  Basic Metabolic Panel:  Recent Labs Lab 08/06/13 1306  NA 143  K 4.6  CL 97  CO2 30  GLUCOSE 208*  BUN 39*  CREATININE 1.24*  CALCIUM 10.8*   CBC:  Recent Labs Lab 08/06/13 1306  WBC 12.2*  HGB 11.1*  HCT 35.9*  MCV 91.6  PLT 246     Radiological Exams on Admission: Dg Chest 2 View (if Patient Has Fever And/or Copd)  08/06/2013   CLINICAL DATA:  Cough and congestion, 2-3 days duration.  Wheezing.  EXAM: CHEST  2 VIEW  COMPARISON:  06/28/2012  FINDINGS: Heart size is normal. There is calcification of the thoracic aorta. There is central bronchial thickening but no evidence of infiltrate, collapse or fusion. No significant bony finding.  IMPRESSION: Bronchial thickening without consolidation or collapse.   Electronically Signed   By: Nelson Chimes M.D.   On: 08/06/2013 14:24    Assessment/Plan Principal Problem:  Acute on chronic respiratory failure with COPD exacerbation - Placed on scheduled bronchodilators and PRN Xopenex and Atrovent, dulera, flovent, IV Solu Medrol, IV Levaquin, flutter valve - Continue O2 via nasal cannula, anti tussives, rule out influenza, obtain sputum culture  Active Problems:     DM (diabetes mellitus), type 2, uncontrolled - Placed on carb modified diet, sliding scale insulin    HTN (hypertension) - Currently stable, continue valsartan-HCTZ     Hyperlipidemia - Continue statin    Restless legs syndrome - Continue pramipexole, Restoril   DVT prophylaxis: Lovenox   CODE STATUS: I discussed in detail  with the patient, she wants to be DNR/DNI status.   Family Communication: Admission, patients condition and plan of care including tests being ordered have been discussed with the patient and daughter who indicates understanding and agree with the plan and Code Status  Dr Inda Merlin will assume care in AM  Further plan will depend as patient's clinical course evolves and further radiologic and laboratory data become available.   Time Spent on Admission: 1 hour  Peighton Edgin Krystal Eaton M.D. [facility] 08/06/2013, 4:22 PM Pager: [phone]  If 7PM-7AM, please contact night-coverage [email or web address] Password [email or web address]  **Disclaimer: This note was dictated with voice recognition software. Similar sounding words can inadvertently be transcribed and this note may contain transcription errors which may not have been corrected upon publication of note.**

## 2013-08-06 NOTE — ED Notes (Signed)
Admit Doctor at bedside.  

## 2013-08-06 NOTE — ED Notes (Signed)
Pt reporting SOB worsening over past few days, productive cough, is on 2 L oxygen. Also some intermittent CP over past few days but not currently. Pt is having wheezing, took breathing treatment this morning. Pt skin is moist and warm states she is always like this. Pt is a x 4.

## 2013-08-06 NOTE — Progress Notes (Signed)
Notified Schorr, NP that pt's CBG is 433. Schorr, NP put in order for Novolog 7 units x1. Will continue to monitor pt. Ranelle Oyster, RN

## 2013-08-07 LAB — GLUCOSE, CAPILLARY
GLUCOSE-CAPILLARY: 361 mg/dL — AB (ref 70–99)
Glucose-Capillary: 275 mg/dL — ABNORMAL HIGH (ref 70–99)
Glucose-Capillary: 212 mg/dL — ABNORMAL HIGH (ref 70–99)
Glucose-Capillary: 218 mg/dL — ABNORMAL HIGH (ref 70–99)
Glucose-Capillary: 294 mg/dL — ABNORMAL HIGH (ref 70–99)

## 2013-08-07 LAB — BASIC METABOLIC PANEL
BUN: 32 mg/dL — ABNORMAL HIGH (ref 6–23)
CALCIUM: 9.4 mg/dL (ref 8.4–10.5)
CO2: 30 mEq/L (ref 19–32)
CREATININE: 0.94 mg/dL (ref 0.50–1.10)
Chloride: 96 mEq/L (ref 96–112)
GFR calc Af Amer: 63 mL/min — ABNORMAL LOW (ref >90)
GFR, EST NON AFRICAN AMERICAN: 54 mL/min — AB (ref >90)
GLUCOSE: 219 mg/dL — AB (ref 70–99)
Potassium: 4.3 mEq/L (ref 3.7–5.3)
SODIUM: 139 meq/L (ref 137–147)

## 2013-08-07 LAB — HEMOGLOBIN A1C
Hgb A1c MFr Bld: 7.6 % — ABNORMAL HIGH (ref <5.7)
Mean Plasma Glucose: 171 mg/dL — ABNORMAL HIGH (ref <117)

## 2013-08-07 LAB — CBC
HCT: 30.6 % — ABNORMAL LOW (ref 36.0–46.0)
Hemoglobin: 9.5 g/dL — ABNORMAL LOW (ref 12.0–15.0)
MCH: 27.9 pg (ref 26.0–34.0)
MCHC: 31 g/dL (ref 30.0–36.0)
MCV: 90 fL (ref 78.0–100.0)
PLATELETS: 224 10*3/uL (ref 150–400)
RBC: 3.4 MIL/uL — AB (ref 3.87–5.11)
RDW: 14.8 % (ref 11.5–15.5)
WBC: 9.1 10*3/uL (ref 4.0–10.5)

## 2013-08-07 MED ORDER — METHYLPREDNISOLONE SODIUM SUCC 125 MG IJ SOLR
60.0000 mg | Freq: Two times a day (BID) | INTRAMUSCULAR | Status: DC
  Administered 2013-08-07: 60 mg via INTRAVENOUS
  Filled 2013-08-07 (×3): qty 0.96

## 2013-08-07 NOTE — Progress Notes (Signed)
Subjective: Patient feeling much better today. Can quickly taper Solu-Medrol. Talked to her about palliative care and hospice briefly if she were to not stabilize and continue to worsen. Quite possible that she may stabilize well but again it is the coming much more difficult for her to be alone at home, especially if COPD exacerbations become more frequent. Spoke with her daughter [name] yesterday. At that time she was much more critically ill.  Objective: Weight change:   Intake/Output Summary (Last 24 hours) at 08/07/13 1323 Last data filed at 08/07/13 1300  Gross per 24 hour  Intake 1468.33 ml  Output    450 ml  Net 1018.33 ml   Filed Vitals:   08/07/13 0310 08/07/13 0339 08/07/13 0808 08/07/13 1149  BP:  123/61    Pulse:  88    Temp:  98 F (36.7 C)    TempSrc:  Oral    Resp:  20    Height:      Weight:      SpO2: 94% 92% 93% 97%    General Appearance: Alert, cooperative, no distress, appears stated age Lungs: Faint end expiratory wheezes present. Breath sounds are distant. Some rales in bases Heart: Regular rate and rhythm, S1 and S2 normal, no murmur, rub or gallop Abdomen: Soft, non-tender, bowel sounds active all four quadrants, no masses, no organomegaly Extremities: Extremities normal, atraumatic, no cyanosis or edema Neuro: oriented x3, nonfocal  Lab Results: Results for orders placed during the hospital encounter of 08/06/13 (from the past 48 hour(s))  BASIC METABOLIC PANEL     Status: Abnormal   Collection Time    08/06/13  1:06 PM      Result Value Ref Range   Sodium 143  137 - 147 mEq/L   Potassium 4.6  3.7 - 5.3 mEq/L   Chloride 97  96 - 112 mEq/L   CO2 30  19 - 32 mEq/L   Glucose, Bld 208 (*) 70 - 99 mg/dL   BUN 39 (*) 6 - 23 mg/dL   Creatinine, Ser 1.24 (*) 0.50 - 1.10 mg/dL   Calcium 10.8 (*) 8.4 - 10.5 mg/dL   GFR calc non Af Amer 39 (*) >90 mL/min   GFR calc Af Amer 45 (*) >90 mL/min   Comment: (NOTE)     The eGFR has been calculated using the  CKD EPI equation.     This calculation has not been validated in all clinical situations.     eGFR's persistently <90 mL/min signify possible Chronic Kidney     Disease.  CBC     Status: Abnormal   Collection Time    08/06/13  1:06 PM      Result Value Ref Range   WBC 12.2 (*) 4.0 - 10.5 K/uL   RBC 3.92  3.87 - 5.11 MIL/uL   Hemoglobin 11.1 (*) 12.0 - 15.0 g/dL   HCT 35.9 (*) 36.0 - 46.0 %   MCV 91.6  78.0 - 100.0 fL   MCH 28.3  26.0 - 34.0 pg   MCHC 30.9  30.0 - 36.0 g/dL   RDW 14.9  11.5 - 15.5 %   Platelets 246  150 - 400 K/uL  PRO B NATRIURETIC PEPTIDE     Status: None   Collection Time    08/06/13  1:16 PM      Result Value Ref Range   Pro B Natriuretic peptide (BNP) 135.5  0 - 450 pg/mL  I-STAT TROPOININ, ED     Status: None  Collection Time    08/06/13  2:40 PM      Result Value Ref Range   Troponin i, poc 0.02  0.00 - 0.08 ng/mL   Comment 3            Comment: Due to the release kinetics of cTnI,     a negative result within the first hours     of the onset of symptoms does not rule out     myocardial infarction with certainty.     If myocardial infarction is still suspected,     repeat the test at appropriate intervals.  INFLUENZA PANEL BY PCR (TYPE A & B, H1N1)     Status: None   Collection Time    08/06/13  4:44 PM      Result Value Ref Range   Influenza A By PCR NEGATIVE  NEGATIVE   Influenza B By PCR NEGATIVE  NEGATIVE   H1N1 flu by pcr NOT DETECTED  NOT DETECTED   Comment:            The Xpert Flu assay (FDA approved for     nasal aspirates or washes and     nasopharyngeal swab specimens), is     intended as an aid in the diagnosis of     influenza and should not be used as     a sole basis for treatment.  HEMOGLOBIN A1C     Status: Abnormal   Collection Time    08/06/13  7:35 PM      Result Value Ref Range   Hemoglobin A1C 7.6 (*) <5.7 %   Comment: (NOTE)                                                                               According to  the ADA Clinical Practice Recommendations for 2011, when     HbA1c is used as a screening test:      >=6.5%   Diagnostic of Diabetes Mellitus               (if abnormal result is confirmed)     5.7-6.4%   Increased risk of developing Diabetes Mellitus     References:Diagnosis and Classification of Diabetes Mellitus,Diabetes     JSHF,0263,78(HYIFO 1):S62-S69 and Standards of Medical Care in             Diabetes - 2011,Diabetes Care,2011,34 (Suppl 1):S11-S61.   Mean Plasma Glucose 171 (*) <117 mg/dL   Comment: Performed at [facility]  CBC     Status: Abnormal   Collection Time    08/06/13  7:35 PM      Result Value Ref Range   WBC 9.7  4.0 - 10.5 K/uL   RBC 3.59 (*) 3.87 - 5.11 MIL/uL   Hemoglobin 10.0 (*) 12.0 - 15.0 g/dL   HCT 32.8 (*) 36.0 - 46.0 %   MCV 91.4  78.0 - 100.0 fL   MCH 27.9  26.0 - 34.0 pg   MCHC 30.5  30.0 - 36.0 g/dL   RDW 14.9  11.5 - 15.5 %   Platelets 229  150 - 400 K/uL  CREATININE, SERUM     Status: Abnormal  Collection Time    08/06/13  7:35 PM      Result Value Ref Range   Creatinine, Ser 1.09  0.50 - 1.10 mg/dL   GFR calc non Af Amer 45 (*) >90 mL/min   GFR calc Af Amer 52 (*) >90 mL/min   Comment: (NOTE)     The eGFR has been calculated using the CKD EPI equation.     This calculation has not been validated in all clinical situations.     eGFR's persistently <90 mL/min signify possible Chronic Kidney     Disease.  CULTURE, EXPECTORATED SPUTUM-ASSESSMENT     Status: None   Collection Time    08/06/13  7:57 PM      Result Value Ref Range   Specimen Description SPUTUM     Special Requests Normal     Sputum evaluation       Value: THIS SPECIMEN IS ACCEPTABLE. RESPIRATORY CULTURE REPORT TO FOLLOW.   Report Status 08/06/2013 FINAL    CULTURE, RESPIRATORY (NON-EXPECTORATED)     Status: None   Collection Time    08/06/13  7:57 PM      Result Value Ref Range   Specimen Description SPUTUM     Special Requests NONE     Gram Stain        Value: ABUNDANT WBC PRESENT, PREDOMINANTLY PMN     NO SQUAMOUS EPITHELIAL CELLS SEEN     ABUNDANT GRAM POSITIVE RODS     FEW GRAM POSITIVE COCCI IN PAIRS     Performed at [facility]   Culture PENDING     Report Status PENDING    MRSA PCR SCREENING     Status: None   Collection Time    08/06/13  8:44 PM      Result Value Ref Range   MRSA by PCR NEGATIVE  NEGATIVE   Comment:            The GeneXpert MRSA Assay (FDA     approved for NASAL specimens     only), is one component of a     comprehensive MRSA colonization     surveillance program. It is not     intended to diagnose MRSA     infection nor to guide or     monitor treatment for     MRSA infections.  GLUCOSE, CAPILLARY     Status: Abnormal   Collection Time    08/06/13  9:39 PM      Result Value Ref Range   Glucose-Capillary 433 (*) 70 - 99 mg/dL  GLUCOSE, CAPILLARY     Status: Abnormal   Collection Time    08/07/13 12:41 AM      Result Value Ref Range   Glucose-Capillary 275 (*) 70 - 99 mg/dL  BASIC METABOLIC PANEL     Status: Abnormal   Collection Time    08/07/13  6:40 AM      Result Value Ref Range   Sodium 139  137 - 147 mEq/L   Potassium 4.3  3.7 - 5.3 mEq/L   Chloride 96  96 - 112 mEq/L   CO2 30  19 - 32 mEq/L   Glucose, Bld 219 (*) 70 - 99 mg/dL   BUN 32 (*) 6 - 23 mg/dL   Creatinine, Ser 0.94  0.50 - 1.10 mg/dL   Calcium 9.4  8.4 - 10.5 mg/dL   GFR calc non Af Amer 54 (*) >90 mL/min   GFR calc Af Amer 63 (*) >90  mL/min   Comment: (NOTE)     The eGFR has been calculated using the CKD EPI equation.     This calculation has not been validated in all clinical situations.     eGFR's persistently <90 mL/min signify possible Chronic Kidney     Disease.  CBC     Status: Abnormal   Collection Time    08/07/13  6:40 AM      Result Value Ref Range   WBC 9.1  4.0 - 10.5 K/uL   RBC 3.40 (*) 3.87 - 5.11 MIL/uL   Hemoglobin 9.5 (*) 12.0 - 15.0 g/dL   HCT 30.6 (*) 36.0 - 46.0 %   MCV 90.0  78.0 -  100.0 fL   MCH 27.9  26.0 - 34.0 pg   MCHC 31.0  30.0 - 36.0 g/dL   RDW 14.8  11.5 - 15.5 %   Platelets 224  150 - 400 K/uL  GLUCOSE, CAPILLARY     Status: Abnormal   Collection Time    08/07/13  7:54 AM      Result Value Ref Range   Glucose-Capillary 212 (*) 70 - 99 mg/dL  GLUCOSE, CAPILLARY     Status: Abnormal   Collection Time    08/07/13 12:13 PM      Result Value Ref Range   Glucose-Capillary 218 (*) 70 - 99 mg/dL    Studies/Results: Dg Chest 2 View (if Patient Has Fever And/or Copd)  08/06/2013   CLINICAL DATA:  Cough and congestion, 2-3 days duration.  Wheezing.  EXAM: CHEST  2 VIEW  COMPARISON:  06/28/2012  FINDINGS: Heart size is normal. There is calcification of the thoracic aorta. There is central bronchial thickening but no evidence of infiltrate, collapse or fusion. No significant bony finding.  IMPRESSION: Bronchial thickening without consolidation or collapse.   Electronically Signed   By: Nelson Chimes M.D.   On: 08/06/2013 14:24   Medications: Scheduled Meds: . antiseptic oral rinse  15 mL Mouth Rinse BID  . aspirin  325 mg Oral Daily  . benzonatate  100 mg Oral TID  . cholecalciferol  2,000 Units Oral Daily  . citalopram  20 mg Oral Daily  . enoxaparin (LOVENOX) injection  40 mg Subcutaneous Q24H  . fluticasone  2 puff Inhalation BID  . guaiFENesin  600 mg Oral BID  . hydrochlorothiazide  25 mg Oral Daily  . insulin aspart  0-5 Units Subcutaneous QHS  . insulin aspart  0-9 Units Subcutaneous TID WC  . levalbuterol  0.63 mg Nebulization Q4H   And  . ipratropium  0.5 mg Nebulization Q4H  . irbesartan  150 mg Oral Daily  . levofloxacin (LEVAQUIN) IV  750 mg Intravenous Q48H  . loratadine  10 mg Oral Daily  . methylPREDNISolone (SOLU-MEDROL) injection  60 mg Intravenous Q6H  . mometasone-formoterol  2 puff Inhalation BID  . polyethylene glycol  17 g Oral Daily  . pramipexole  0.125-0.25 mg Oral QHS  . simvastatin  40 mg Oral QPM  . temazepam  15 mg Oral  QHS,MR X 1   Continuous Infusions: . sodium chloride 10 mL/hr at 08/07/13 0608   PRN Meds:.acetaminophen, acetaminophen, guaiFENesin-dextromethorphan, HYDROmorphone (DILAUDID) injection, ibuprofen, levalbuterol, ondansetron (ZOFRAN) IV, ondansetron  Assessment/Plan: Principal Problem:   COPD exacerbation - symptomatically improved. We'll taper Solu-Medrol to every 12 and try to switch to prednisone tomorrow. Activity has become more exhausting as of late. Does not want placement in a long-term care facility if at all possible Active  Problems:   Respiratory failure, acute-on-chronic - may be becoming more symptomatic. May have to start palliative care and hospice at some point soon. Discussed briefly with patient today though she feels much improved today    DM (diabetes mellitus), type 2, uncontrolled - continue usual medications and sliding scale insulin     HTN (hypertension) - controlled   Hyperlipidemia - aware   Restless legs syndrome - aware   History of NSTEMI - aware   Patient is a no CODE BLUE   LOS: 1 day   Henrine Screws, MD 08/07/2013, 1:23 PM

## 2013-08-07 NOTE — Progress Notes (Signed)
Utilization review completed.  

## 2013-08-07 NOTE — Progress Notes (Signed)
Inpatient Diabetes Program Recommendations  AACE/ADA: New Consensus Statement on Inpatient Glycemic Control (2013)  Target Ranges:  Prepandial:   less than 140 mg/dL      Peak postprandial:   less than 180 mg/dL (1-2 hours)      Critically ill patients:  140 - 180 mg/dL    Results for [name], [name] (MRN [id number]) as of 08/07/2013 08:17  Ref. Range 08/06/2013 21:39 08/07/2013 00:41 08/07/2013 07:54  Glucose-Capillary Latest Range: 70-99 mg/dL 433 (H) 275 (H) 212 (H)    Admitted with COPD flare.  Has history of Type 2 DM, MI, HTN, COPD.  Home DM Meds: Metformin 500 mg bid   Patient given 125 mg IV Solumedrol yesterday at 2pm.  Patient then started on IV Solumedrol 60 mg Q6 hours.  Current insulin orders are for Novolog Sensitive SSI tid ac + HS.    MD- Please consider the following insulin adjustments while pt on IV steroids:  1. Add basal insulin- Lantus 15 units daily (0.2 units/kg) 2. Increase SSI to Moderate scale tid ac + HS    Will follow Wyn Quaker RN, MSN, CDE Diabetes Coordinator Inpatient Diabetes Program Team Pager: [phone] (8a-10p)

## 2013-08-08 LAB — GLUCOSE, CAPILLARY
GLUCOSE-CAPILLARY: 259 mg/dL — AB (ref 70–99)
GLUCOSE-CAPILLARY: 288 mg/dL — AB (ref 70–99)
Glucose-Capillary: 208 mg/dL — ABNORMAL HIGH (ref 70–99)
Glucose-Capillary: 206 mg/dL — ABNORMAL HIGH (ref 70–99)

## 2013-08-08 LAB — CBC WITH DIFFERENTIAL/PLATELET
BASOS ABS: 0 10*3/uL (ref 0.0–0.1)
Basophils Relative: 0 % (ref 0–1)
EOS PCT: 0 % (ref 0–5)
Eosinophils Absolute: 0 10*3/uL (ref 0.0–0.7)
HCT: 31.2 % — ABNORMAL LOW (ref 36.0–46.0)
HEMOGLOBIN: 9.6 g/dL — AB (ref 12.0–15.0)
LYMPHS ABS: 0.9 10*3/uL (ref 0.7–4.0)
Lymphocytes Relative: 6 % — ABNORMAL LOW (ref 12–46)
MCH: 27.7 pg (ref 26.0–34.0)
MCHC: 30.8 g/dL (ref 30.0–36.0)
MCV: 90.2 fL (ref 78.0–100.0)
MONO ABS: 0.6 10*3/uL (ref 0.1–1.0)
Monocytes Relative: 4 % (ref 3–12)
NEUTROS ABS: 12.5 10*3/uL — AB (ref 1.7–7.7)
Neutrophils Relative %: 90 % — ABNORMAL HIGH (ref 43–77)
Platelets: 262 10*3/uL (ref 150–400)
RBC: 3.46 MIL/uL — ABNORMAL LOW (ref 3.87–5.11)
RDW: 14.8 % (ref 11.5–15.5)
WBC: 14 10*3/uL — ABNORMAL HIGH (ref 4.0–10.5)

## 2013-08-08 LAB — BASIC METABOLIC PANEL
BUN: 37 mg/dL — AB (ref 6–23)
CALCIUM: 9.6 mg/dL (ref 8.4–10.5)
CHLORIDE: 97 meq/L (ref 96–112)
CO2: 30 meq/L (ref 19–32)
CREATININE: 1.12 mg/dL — AB (ref 0.50–1.10)
GFR calc Af Amer: 51 mL/min — ABNORMAL LOW (ref >90)
GFR calc non Af Amer: 44 mL/min — ABNORMAL LOW (ref >90)
GLUCOSE: 216 mg/dL — AB (ref 70–99)
Potassium: 4.5 mEq/L (ref 3.7–5.3)
Sodium: 140 mEq/L (ref 137–147)

## 2013-08-08 MED ORDER — PRAMIPEXOLE DIHYDROCHLORIDE 0.25 MG PO TABS
0.2500 mg | ORAL_TABLET | Freq: Every day | ORAL | Status: DC
  Administered 2013-08-08 – 2013-08-12 (×5): 0.25 mg via ORAL
  Filled 2013-08-08 (×8): qty 1

## 2013-08-08 MED ORDER — METHYLPREDNISOLONE SODIUM SUCC 125 MG IJ SOLR
60.0000 mg | Freq: Four times a day (QID) | INTRAMUSCULAR | Status: DC
  Administered 2013-08-08 – 2013-08-09 (×6): 60 mg via INTRAVENOUS
  Filled 2013-08-08 (×2): qty 0.96
  Filled 2013-08-08: qty 2
  Filled 2013-08-08 (×7): qty 0.96

## 2013-08-08 NOTE — Progress Notes (Signed)
Palliative medicine consult received. Goals of care meeting scheduled for 4:30PM 4/23 with patient and her daughter [name] to discuss hospice options and options for improving her QOL facing serious debilitating illness.  Lane Hacker, DO Palliative Medicine

## 2013-08-08 NOTE — Evaluation (Signed)
Physical Therapy Evaluation Patient Details Name: [name] MRN: [id number] DOB: 17-May-1927 Today's Date: 08/08/2013   History of Present Illness  Patient is 80 year old female with history of chronic respiratory failure on 2 L O2 via nasal cannula at home, COPD, hypertension, CAD presented to the ER with shortness of breath and wheezing that has worsened in the last 3 days.  Clinical Impression  Patient presents with decreased independence with mobility due to deficits listed below in PT problem list.  She will benefit from skilled PT in the acute setting to allow return home with intermittent family and aide assist and HHPT.  Reports daughter hiring her assist 4 hours 2-3 days a week.    Follow Up Recommendations Home health PT;Supervision - Intermittent    Equipment Recommendations  None recommended by PT    Recommendations for Other Services       Precautions / Restrictions Precautions Precautions: Fall Precaution Comments: most recent fall about a year ago Restrictions Weight Bearing Restrictions: No      Mobility  Bed Mobility Overal bed mobility: Modified Independent                Transfers Overall transfer level: Needs assistance   Transfers: Sit to/from Stand Sit to Stand: Min guard         General transfer comment: assist for safety  Ambulation/Gait Ambulation/Gait assistance: Min guard Ambulation Distance (Feet): 90 Feet (x 2) Assistive device: Rolling walker (2 wheeled) Gait Pattern/deviations: Step-through pattern;Antalgic     General Gait Details: initially antalgic with c/o cramp left LE.  stopped to rest in standing x 1; pt SOB and HR >130 so seated rest prior to return  to room  Stairs            Wheelchair Mobility    Modified Rankin (Stroke Patients Only)       Balance Overall balance assessment: Needs assistance;History of Falls Sitting-balance support: Feet supported Sitting balance-Leahy Scale: Good      Standing balance support: Bilateral upper extremity supported Standing balance-Leahy Scale: Fair Standing balance comment: needs UE support for ambulation, but static stance okay w/o UE assist                             Pertinent Vitals/Pain HR 132 with ambulation on 2L O2    Home Living Family/patient expects to be discharged to:: Private residence Living Arrangements: Alone Available Help at Discharge: Friend(s);Available PRN/intermittently Type of Home: Mobile home Home Access: Stairs to enter Entrance Stairs-Rails: Left;Right Entrance Stairs-Number of Steps: 3 Home Layout: One level Home Equipment: Walker - 4 wheels;Other (comment);Shower seat;Grab bars - tub/shower;Cane - quad;Wheelchair - manual (oxygen) Additional Comments: has walk in shower    Prior Function Level of Independence: Independent with assistive device(s)         Comments: daughter assists with getting groceries (takes pt to store); has help 2x/month for cleaning     Hand Dominance        Extremity/Trunk Assessment               Lower Extremity Assessment: RLE deficits/detail;LLE deficits/detail RLE Deficits / Details: AROM WFL strength grossly 4/5 LLE Deficits / Details: AROM WFL strength grossly 4/5     Communication   Communication: HOH  Cognition Arousal/Alertness: Awake/alert Behavior During Therapy: WFL for tasks assessed/performed Overall Cognitive Status: Within Functional Limits for tasks assessed  General Comments  Initiated education in energy conservation ideas for improving tolerance to activities at home.    Exercises        Assessment/Plan    PT Assessment Patient needs continued PT services  PT Diagnosis Difficulty walking;Generalized weakness   PT Problem List Decreased activity tolerance;Decreased balance;Cardiopulmonary status limiting activity;Decreased strength;Decreased mobility  PT Treatment Interventions DME  instruction;Therapeutic exercise;Gait training;Balance training;Functional mobility training;Cognitive remediation;Stair training;Therapeutic activities;Patient/family education   PT Goals (Current goals can be found in the Care Plan section) Acute Rehab PT Goals Patient Stated Goal: To return home independent as possible PT Goal Formulation: With patient Time For Goal Achievement: 08/22/13 Potential to Achieve Goals: Good    Frequency Min 3X/week   Barriers to discharge Decreased caregiver support      Co-evaluation               End of Session Equipment Utilized During Treatment: Gait belt Activity Tolerance: Patient limited by fatigue Patient left: in bed;with call bell/phone within reach;with nursing/sitter in room           Time: 1335-1410 PT Time Calculation (min): 35 min   Charges:   PT Evaluation $Initial PT Evaluation Tier I: 1 Procedure PT Treatments $Gait Training: 8-22 mins $Self Care/Home Management: 8-22   PT G Codes:          Max Sane 08/08/2013, 2:38 PM Magda Kiel, [place] 08/08/2013

## 2013-08-08 NOTE — Progress Notes (Signed)
Inpatient Diabetes Program Recommendations  AACE/ADA: New Consensus Statement on Inpatient Glycemic Control (2013)  Target Ranges:  Prepandial:   less than 140 mg/dL      Peak postprandial:   less than 180 mg/dL (1-2 hours)      Critically ill patients:  140 - 180 mg/dL     Results for [name], [name] (MRN [id number]) as of 08/08/2013 08:45  Ref. Range 08/07/2013 07:54 08/07/2013 12:13 08/07/2013 17:30 08/07/2013 21:02  Glucose-Capillary Latest Range: 70-99 mg/dL 212 (H) 218 (H) 361 (H) 294 (H)    Results for [name], [name] (MRN [id number]) as of 08/08/2013 08:45  Ref. Range 08/08/2013 08:05  Glucose-Capillary Latest Range: 70-99 mg/dL 208 (H)     Patient currently receiving IV Solumedrol 60 mg Q6 hours.   Current insulin orders are for Novolog Sensitive SSI tid ac + HS.    MD- Please consider the following insulin adjustments while pt on IV steroids:   1. Add basal insulin- Lantus 15 units daily (0.2 units/kg)  2. Increase SSI to Moderate scale tid ac + HS   Will follow Wyn Quaker RN, MSN, CDE Diabetes Coordinator Inpatient Diabetes Program Team Pager: [phone] (8a-10p)

## 2013-08-08 NOTE — Progress Notes (Addendum)
Subjective: With reduction in steroids, [name] is wheezing more today.  We'll have to increase Solu-Medrol back to every 6 hours.  Pulmonary therapy in my opinion has been optimized.  Will discuss with pulmonary per critical care.  Spoke with patient at length today about the possibility of palliative care and hospice and she's not sure in which direction she would like to go in terms of aggressive therapy continuing or reducing therapy and approach her clinical condition with more of a comfort approach.  Will speak with her daughter [name] today as well.  Objective: Weight change:   Intake/Output Summary (Last 24 hours) at 08/08/13 0737 Last data filed at 08/08/13 0243  Gross per 24 hour  Intake 780.84 ml  Output    800 ml  Net -19.16 ml   Filed Vitals:   08/07/13 2104 08/07/13 2308 08/08/13 0318 08/08/13 0443  BP: 134/67   157/78  Pulse:    75  Temp:    97.7 F (36.5 C)  TempSrc:    Oral  Resp:    20  Height:      Weight:      SpO2:  94% 95% 97%    General Appearance: Alert, cooperative,  appears stated age, mild respiratory distress Lungs: Diffuse wheezing, mild to moderate Heart: Regular rate and rhythm, S1 and S2 normal, no murmur, rub or gallop Abdomen: Soft, non-tender, bowel sounds active all four quadrants, no masses, no organomegaly Extremities: Extremities normal, atraumatic, no cyanosis or edema Neuro: Alert and oriented x3, nonfocal  Lab Results: Results for orders placed during the hospital encounter of 08/06/13 (from the past 48 hour(s))  BASIC METABOLIC PANEL     Status: Abnormal   Collection Time    08/06/13  1:06 PM      Result Value Ref Range   Sodium 143  137 - 147 mEq/L   Potassium 4.6  3.7 - 5.3 mEq/L   Chloride 97  96 - 112 mEq/L   CO2 30  19 - 32 mEq/L   Glucose, Bld 208 (*) 70 - 99 mg/dL   BUN 39 (*) 6 - 23 mg/dL   Creatinine, Ser 1.24 (*) 0.50 - 1.10 mg/dL   Calcium 10.8 (*) 8.4 - 10.5 mg/dL   GFR calc non Af Amer 39 (*) >90 mL/min   GFR calc Af  Amer 45 (*) >90 mL/min   Comment: (NOTE)     The eGFR has been calculated using the CKD EPI equation.     This calculation has not been validated in all clinical situations.     eGFR's persistently <90 mL/min signify possible Chronic Kidney     Disease.  CBC     Status: Abnormal   Collection Time    08/06/13  1:06 PM      Result Value Ref Range   WBC 12.2 (*) 4.0 - 10.5 K/uL   RBC 3.92  3.87 - 5.11 MIL/uL   Hemoglobin 11.1 (*) 12.0 - 15.0 g/dL   HCT 35.9 (*) 36.0 - 46.0 %   MCV 91.6  78.0 - 100.0 fL   MCH 28.3  26.0 - 34.0 pg   MCHC 30.9  30.0 - 36.0 g/dL   RDW 14.9  11.5 - 15.5 %   Platelets 246  150 - 400 K/uL  PRO B NATRIURETIC PEPTIDE     Status: None   Collection Time    08/06/13  1:16 PM      Result Value Ref Range   Pro B Natriuretic peptide (  BNP) 135.5  0 - 450 pg/mL  I-STAT TROPOININ, ED     Status: None   Collection Time    08/06/13  2:40 PM      Result Value Ref Range   Troponin i, poc 0.02  0.00 - 0.08 ng/mL   Comment 3            Comment: Due to the release kinetics of cTnI,     a negative result within the first hours     of the onset of symptoms does not rule out     myocardial infarction with certainty.     If myocardial infarction is still suspected,     repeat the test at appropriate intervals.  INFLUENZA PANEL BY PCR (TYPE A & B, H1N1)     Status: None   Collection Time    08/06/13  4:44 PM      Result Value Ref Range   Influenza A By PCR NEGATIVE  NEGATIVE   Influenza B By PCR NEGATIVE  NEGATIVE   H1N1 flu by pcr NOT DETECTED  NOT DETECTED   Comment:            The Xpert Flu assay (FDA approved for     nasal aspirates or washes and     nasopharyngeal swab specimens), is     intended as an aid in the diagnosis of     influenza and should not be used as     a sole basis for treatment.  HEMOGLOBIN A1C     Status: Abnormal   Collection Time    08/06/13  7:35 PM      Result Value Ref Range   Hemoglobin A1C 7.6 (*) <5.7 %   Comment: (NOTE)                                                                                According to the ADA Clinical Practice Recommendations for 2011, when     HbA1c is used as a screening test:      >=6.5%   Diagnostic of Diabetes Mellitus               (if abnormal result is confirmed)     5.7-6.4%   Increased risk of developing Diabetes Mellitus     References:Diagnosis and Classification of Diabetes Mellitus,Diabetes     VOZD,6644,03(KVQQV 1):S62-S69 and Standards of Medical Care in             Diabetes - 2011,Diabetes Care,2011,34 (Suppl 1):S11-S61.   Mean Plasma Glucose 171 (*) <117 mg/dL   Comment: Performed at [facility]  CBC     Status: Abnormal   Collection Time    08/06/13  7:35 PM      Result Value Ref Range   WBC 9.7  4.0 - 10.5 K/uL   RBC 3.59 (*) 3.87 - 5.11 MIL/uL   Hemoglobin 10.0 (*) 12.0 - 15.0 g/dL   HCT 32.8 (*) 36.0 - 46.0 %   MCV 91.4  78.0 - 100.0 fL   MCH 27.9  26.0 - 34.0 pg   MCHC 30.5  30.0 - 36.0 g/dL   RDW 14.9  11.5 - 15.5 %  Platelets 229  150 - 400 K/uL  CREATININE, SERUM     Status: Abnormal   Collection Time    08/06/13  7:35 PM      Result Value Ref Range   Creatinine, Ser 1.09  0.50 - 1.10 mg/dL   GFR calc non Af Amer 45 (*) >90 mL/min   GFR calc Af Amer 52 (*) >90 mL/min   Comment: (NOTE)     The eGFR has been calculated using the CKD EPI equation.     This calculation has not been validated in all clinical situations.     eGFR's persistently <90 mL/min signify possible Chronic Kidney     Disease.  CULTURE, EXPECTORATED SPUTUM-ASSESSMENT     Status: None   Collection Time    08/06/13  7:57 PM      Result Value Ref Range   Specimen Description SPUTUM     Special Requests Normal     Sputum evaluation       Value: THIS SPECIMEN IS ACCEPTABLE. RESPIRATORY CULTURE REPORT TO FOLLOW.   Report Status 08/06/2013 FINAL    CULTURE, RESPIRATORY (NON-EXPECTORATED)     Status: None   Collection Time    08/06/13  7:57 PM      Result Value Ref Range    Specimen Description SPUTUM     Special Requests NONE     Gram Stain       Value: ABUNDANT WBC PRESENT, PREDOMINANTLY PMN     NO SQUAMOUS EPITHELIAL CELLS SEEN     ABUNDANT GRAM POSITIVE RODS     FEW GRAM POSITIVE COCCI IN PAIRS     Performed at [facility]   Culture PENDING     Report Status PENDING    MRSA PCR SCREENING     Status: None   Collection Time    08/06/13  8:44 PM      Result Value Ref Range   MRSA by PCR NEGATIVE  NEGATIVE   Comment:            The GeneXpert MRSA Assay (FDA     approved for NASAL specimens     only), is one component of a     comprehensive MRSA colonization     surveillance program. It is not     intended to diagnose MRSA     infection nor to guide or     monitor treatment for     MRSA infections.  GLUCOSE, CAPILLARY     Status: Abnormal   Collection Time    08/06/13  9:39 PM      Result Value Ref Range   Glucose-Capillary 433 (*) 70 - 99 mg/dL  GLUCOSE, CAPILLARY     Status: Abnormal   Collection Time    08/07/13 12:41 AM      Result Value Ref Range   Glucose-Capillary 275 (*) 70 - 99 mg/dL  BASIC METABOLIC PANEL     Status: Abnormal   Collection Time    08/07/13  6:40 AM      Result Value Ref Range   Sodium 139  137 - 147 mEq/L   Potassium 4.3  3.7 - 5.3 mEq/L   Chloride 96  96 - 112 mEq/L   CO2 30  19 - 32 mEq/L   Glucose, Bld 219 (*) 70 - 99 mg/dL   BUN 32 (*) 6 - 23 mg/dL   Creatinine, Ser 0.94  0.50 - 1.10 mg/dL   Calcium 9.4  8.4 - 10.5 mg/dL  GFR calc non Af Amer 54 (*) >90 mL/min   GFR calc Af Amer 63 (*) >90 mL/min   Comment: (NOTE)     The eGFR has been calculated using the CKD EPI equation.     This calculation has not been validated in all clinical situations.     eGFR's persistently <90 mL/min signify possible Chronic Kidney     Disease.  CBC     Status: Abnormal   Collection Time    08/07/13  6:40 AM      Result Value Ref Range   WBC 9.1  4.0 - 10.5 K/uL   RBC 3.40 (*) 3.87 - 5.11 MIL/uL   Hemoglobin  9.5 (*) 12.0 - 15.0 g/dL   HCT 30.6 (*) 36.0 - 46.0 %   MCV 90.0  78.0 - 100.0 fL   MCH 27.9  26.0 - 34.0 pg   MCHC 31.0  30.0 - 36.0 g/dL   RDW 14.8  11.5 - 15.5 %   Platelets 224  150 - 400 K/uL  GLUCOSE, CAPILLARY     Status: Abnormal   Collection Time    08/07/13  7:54 AM      Result Value Ref Range   Glucose-Capillary 212 (*) 70 - 99 mg/dL  GLUCOSE, CAPILLARY     Status: Abnormal   Collection Time    08/07/13 12:13 PM      Result Value Ref Range   Glucose-Capillary 218 (*) 70 - 99 mg/dL  GLUCOSE, CAPILLARY     Status: Abnormal   Collection Time    08/07/13  5:30 PM      Result Value Ref Range   Glucose-Capillary 361 (*) 70 - 99 mg/dL  GLUCOSE, CAPILLARY     Status: Abnormal   Collection Time    08/07/13  9:02 PM      Result Value Ref Range   Glucose-Capillary 294 (*) 70 - 99 mg/dL  CBC WITH DIFFERENTIAL     Status: Abnormal   Collection Time    08/08/13  3:15 AM      Result Value Ref Range   WBC 14.0 (*) 4.0 - 10.5 K/uL   RBC 3.46 (*) 3.87 - 5.11 MIL/uL   Hemoglobin 9.6 (*) 12.0 - 15.0 g/dL   HCT 31.2 (*) 36.0 - 46.0 %   MCV 90.2  78.0 - 100.0 fL   MCH 27.7  26.0 - 34.0 pg   MCHC 30.8  30.0 - 36.0 g/dL   RDW 14.8  11.5 - 15.5 %   Platelets 262  150 - 400 K/uL   Neutrophils Relative % 90 (*) 43 - 77 %   Neutro Abs 12.5 (*) 1.7 - 7.7 K/uL   Lymphocytes Relative 6 (*) 12 - 46 %   Lymphs Abs 0.9  0.7 - 4.0 K/uL   Monocytes Relative 4  3 - 12 %   Monocytes Absolute 0.6  0.1 - 1.0 K/uL   Eosinophils Relative 0  0 - 5 %   Eosinophils Absolute 0.0  0.0 - 0.7 K/uL   Basophils Relative 0  0 - 1 %   Basophils Absolute 0.0  0.0 - 0.1 K/uL  BASIC METABOLIC PANEL     Status: Abnormal   Collection Time    08/08/13  3:15 AM      Result Value Ref Range   Sodium 140  137 - 147 mEq/L   Potassium 4.5  3.7 - 5.3 mEq/L   Chloride 97  96 - 112 mEq/L  CO2 30  19 - 32 mEq/L   Glucose, Bld 216 (*) 70 - 99 mg/dL   BUN 37 (*) 6 - 23 mg/dL   Creatinine, Ser 1.12 (*) 0.50 - 1.10  mg/dL   Calcium 9.6  8.4 - 10.5 mg/dL   GFR calc non Af Amer 44 (*) >90 mL/min   GFR calc Af Amer 51 (*) >90 mL/min   Comment: (NOTE)     The eGFR has been calculated using the CKD EPI equation.     This calculation has not been validated in all clinical situations.     eGFR's persistently <90 mL/min signify possible Chronic Kidney     Disease.    Studies/Results: Dg Chest 2 View (if Patient Has Fever And/or Copd)  08/06/2013   CLINICAL DATA:  Cough and congestion, 2-3 days duration.  Wheezing.  EXAM: CHEST  2 VIEW  COMPARISON:  06/28/2012  FINDINGS: Heart size is normal. There is calcification of the thoracic aorta. There is central bronchial thickening but no evidence of infiltrate, collapse or fusion. No significant bony finding.  IMPRESSION: Bronchial thickening without consolidation or collapse.   Electronically Signed   By: Nelson Chimes M.D.   On: 08/06/2013 14:24   Medications: Scheduled Meds: . antiseptic oral rinse  15 mL Mouth Rinse BID  . aspirin  325 mg Oral Daily  . benzonatate  100 mg Oral TID  . cholecalciferol  2,000 Units Oral Daily  . citalopram  20 mg Oral Daily  . enoxaparin (LOVENOX) injection  40 mg Subcutaneous Q24H  . fluticasone  2 puff Inhalation BID  . guaiFENesin  600 mg Oral BID  . hydrochlorothiazide  25 mg Oral Daily  . insulin aspart  0-5 Units Subcutaneous QHS  . insulin aspart  0-9 Units Subcutaneous TID WC  . levalbuterol  0.63 mg Nebulization Q4H   And  . ipratropium  0.5 mg Nebulization Q4H  . irbesartan  150 mg Oral Daily  . levofloxacin (LEVAQUIN) IV  750 mg Intravenous Q48H  . loratadine  10 mg Oral Daily  . methylPREDNISolone (SOLU-MEDROL) injection  60 mg Intravenous Q12H  . mometasone-formoterol  2 puff Inhalation BID  . polyethylene glycol  17 g Oral Daily  . pramipexole  0.125-0.25 mg Oral QHS  . simvastatin  40 mg Oral QPM  . temazepam  15 mg Oral QHS,MR X 1   Continuous Infusions: . sodium chloride 50 mL/hr (08/07/13 1333)    PRN Meds:.acetaminophen, acetaminophen, guaiFENesin-dextromethorphan, HYDROmorphone (DILAUDID) injection, ibuprofen, levalbuterol, ondansetron (ZOFRAN) IV, ondansetron  Assessment/Plan: Principal Problem:  COPD exacerbation - symptomatically worse today. We'll increase Solu-Medrol to every 6 hours. Activity has become more exhausting as of late. Does not want placement in a long-term care facility if at all possible  Active Problems:  Respiratory failure, acute-on-chronic - may be becoming more symptomatic. May have to start palliative care and hospice at some point soon. Discussed extensively with patient today though she felt much improved yesterday but has not tolerated the decrease in IV Solu-Medrol.  On review of her pulmonary therapy, I feel like she is at maximum therapy except for the steroids.  DM (diabetes mellitus), type 2, uncontrolled - continue usual medications and sliding scale insulin  HTN (hypertension) - controlled  Hyperlipidemia - aware  Restless legs syndrome - aware  History of NSTEMI - aware  Patient is a no CODE BLUE We'll obtain palliative care consult so that patient can further discuss her desires in terms of therapy, either more aggressive  or less aggressive. We'll go ahead and try physical therapy starting tomorrow Disposition: Hopefully home in the next several days with either hospice or not depending on patient's interaction with palliative care/hospice team.    LOS: 2 days   Henrine Screws, MD 08/08/2013, 7:37 AM

## 2013-08-09 DIAGNOSIS — Z515 Encounter for palliative care: Secondary | ICD-10-CM

## 2013-08-09 DIAGNOSIS — K5900 Constipation, unspecified: Secondary | ICD-10-CM | POA: Diagnosis present

## 2013-08-09 DIAGNOSIS — K649 Unspecified hemorrhoids: Secondary | ICD-10-CM | POA: Diagnosis present

## 2013-08-09 DIAGNOSIS — Z66 Do not resuscitate: Secondary | ICD-10-CM | POA: Diagnosis present

## 2013-08-09 DIAGNOSIS — F4321 Adjustment disorder with depressed mood: Secondary | ICD-10-CM | POA: Diagnosis present

## 2013-08-09 LAB — GLUCOSE, CAPILLARY
GLUCOSE-CAPILLARY: 245 mg/dL — AB (ref 70–99)
GLUCOSE-CAPILLARY: 288 mg/dL — AB (ref 70–99)
GLUCOSE-CAPILLARY: 255 mg/dL — AB (ref 70–99)
Glucose-Capillary: 293 mg/dL — ABNORMAL HIGH (ref 70–99)

## 2013-08-09 LAB — PRO B NATRIURETIC PEPTIDE: Pro B Natriuretic peptide (BNP): 820.4 pg/mL — ABNORMAL HIGH (ref 0–450)

## 2013-08-09 MED ORDER — SENNOSIDES-DOCUSATE SODIUM 8.6-50 MG PO TABS
1.0000 | ORAL_TABLET | Freq: Two times a day (BID) | ORAL | Status: DC
  Administered 2013-08-09 – 2013-08-13 (×9): 1 via ORAL
  Filled 2013-08-09 (×10): qty 1

## 2013-08-09 MED ORDER — HYDROCORTISONE 2.5 % RE CREA
TOPICAL_CREAM | Freq: Two times a day (BID) | RECTAL | Status: DC
  Administered 2013-08-09 – 2013-08-10 (×2): via RECTAL
  Administered 2013-08-10: 1 via RECTAL
  Administered 2013-08-11 – 2013-08-12 (×2): via RECTAL
  Filled 2013-08-09: qty 28.35

## 2013-08-09 MED ORDER — BISACODYL 5 MG PO TBEC
5.0000 mg | DELAYED_RELEASE_TABLET | Freq: Every day | ORAL | Status: DC
  Administered 2013-08-09 – 2013-08-12 (×4): 5 mg via ORAL
  Filled 2013-08-09 (×6): qty 1

## 2013-08-09 MED ORDER — METHYLPREDNISOLONE SODIUM SUCC 125 MG IJ SOLR
60.0000 mg | Freq: Three times a day (TID) | INTRAMUSCULAR | Status: DC
  Administered 2013-08-09 – 2013-08-10 (×3): 60 mg via INTRAVENOUS
  Filled 2013-08-09 (×5): qty 0.96

## 2013-08-09 MED ORDER — MORPHINE SULFATE (CONCENTRATE) 10 MG /0.5 ML PO SOLN
5.0000 mg | ORAL | Status: DC | PRN
  Administered 2013-08-11: 5 mg via ORAL

## 2013-08-09 MED ORDER — ALPRAZOLAM 0.25 MG PO TABS
0.2500 mg | ORAL_TABLET | Freq: Three times a day (TID) | ORAL | Status: DC | PRN
  Filled 2013-08-09: qty 1

## 2013-08-09 NOTE — Progress Notes (Signed)
Physical Therapy Treatment Patient Details Name: [name] MRN: [id number] DOB: 1927/06/07 Today's Date: 2013-09-07    History of Present Illness Patient is 80 year old female with history of chronic respiratory failure on 2 L O2 via nasal cannula at home, COPD, hypertension, CAD presented to the ER with shortness of breath and wheezing that has worsened in the last 3 days.    PT Comments    Patient improved with activity tolerance today; seen just after breathing tx.  Feel could safely negotiate between rooms in her home with walker.    Follow Up Recommendations  Home health PT;Supervision - Intermittent     Equipment Recommendations  None recommended by PT    Recommendations for Other Services       Precautions / Restrictions Precautions Precautions: Fall    Mobility  Bed Mobility Overal bed mobility: Modified Independent             General bed mobility comments: HOB elevated  Transfers   Equipment used: Rolling walker (2 wheeled)   Sit to Stand: Supervision         General transfer comment: pulls up on walker, cues for safety  Ambulation/Gait Ambulation/Gait assistance: Min guard;Supervision Ambulation Distance (Feet): 150 Feet Assistive device: Rolling walker (2 wheeled) Gait Pattern/deviations: Step-through pattern     General Gait Details: HR 113, SpO2 96% after ambulation on 2L O2 with moderate dyspnea and audible wheezing (just after breathing tx)   Stairs            Wheelchair Mobility    Modified Rankin (Stroke Patients Only)       Balance             Standing balance-Leahy Scale: Fair Standing balance comment: needs UE assist for ambulation                    Cognition Arousal/Alertness: Awake/alert Behavior During Therapy: WFL for tasks assessed/performed Overall Cognitive Status: Within Functional Limits for tasks assessed                      Exercises      General Comments         Pertinent Vitals/Pain SpO2 99%, HR 88 at rest; SpO2 96% HR 113 after ambulation on 2L O2    Home Living                      Prior Function            PT Goals (current goals can now be found in the care plan section) Progress towards PT goals: Progressing toward goals    Frequency  Min 3X/week    PT Plan Current plan remains appropriate    Co-evaluation             End of Session Equipment Utilized During Treatment: Gait belt Activity Tolerance: Patient tolerated treatment well Patient left: in chair;with call bell/phone within reach     Time: 1208-1235 PT Time Calculation (min): 27 min  Charges:  $Gait Training: 8-22 mins $Therapeutic Activity: 8-22 mins                    G Codes:      Max Sane Sep 07, 2013, 1:11 PM Magda Kiel, [place] 09-07-2013

## 2013-08-09 NOTE — Progress Notes (Signed)
Subjective: [name] is breathing better today but still tenuous.  She is on high-dose steroids intravenously.  Have optimized nebulizer therapy in my opinion.  Certainly end-stage COPD.  She has pretty severe chronic constipation and because of difficulty with bowel movements he has formed hemorrhoids that are tender and painful.  We'll treat those with Anusol HC and try to improve her bowel program.  Objective: Weight change:   Intake/Output Summary (Last 24 hours) at 08/09/13 1544 Last data filed at 08/09/13 1434  Gross per 24 hour  Intake      0 ml  Output   1500 ml  Net  -1500 ml   Filed Vitals:   08/09/13 0523 08/09/13 0804 08/09/13 1136 08/09/13 1351  BP: 158/64   162/72  Pulse: 85   81  Temp: 97.5 F (36.4 C)   97.3 F (36.3 C)  TempSrc: Oral   Oral  Resp: 18   18  Height:      Weight:      SpO2: 98% 94% 95% 97%    General Appearance: Alert, cooperative, mildly increased work of breathing, chronic Lungs: Distant breath sounds throughout with end expiratory wheeze Heart: Regular rate and rhythm, S1 and S2 normal, no murmur, rub or gallop Abdomen: Soft, non-tender, bowel sounds active all four quadrants, no masses, no organomegaly Extremities: Extremities normal, atraumatic, no cyanosis or edema Neuro: Nonfocal, alert  Lab Results: Results for orders placed during the hospital encounter of 08/06/13 (from the past 48 hour(s))  GLUCOSE, CAPILLARY     Status: Abnormal   Collection Time    08/07/13  5:30 PM      Result Value Ref Range   Glucose-Capillary 361 (*) 70 - 99 mg/dL  GLUCOSE, CAPILLARY     Status: Abnormal   Collection Time    08/07/13  9:02 PM      Result Value Ref Range   Glucose-Capillary 294 (*) 70 - 99 mg/dL  CBC WITH DIFFERENTIAL     Status: Abnormal   Collection Time    08/08/13  3:15 AM      Result Value Ref Range   WBC 14.0 (*) 4.0 - 10.5 K/uL   RBC 3.46 (*) 3.87 - 5.11 MIL/uL   Hemoglobin 9.6 (*) 12.0 - 15.0 g/dL   HCT 31.2 (*) 36.0 -  46.0 %   MCV 90.2  78.0 - 100.0 fL   MCH 27.7  26.0 - 34.0 pg   MCHC 30.8  30.0 - 36.0 g/dL   RDW 14.8  11.5 - 15.5 %   Platelets 262  150 - 400 K/uL   Neutrophils Relative % 90 (*) 43 - 77 %   Neutro Abs 12.5 (*) 1.7 - 7.7 K/uL   Lymphocytes Relative 6 (*) 12 - 46 %   Lymphs Abs 0.9  0.7 - 4.0 K/uL   Monocytes Relative 4  3 - 12 %   Monocytes Absolute 0.6  0.1 - 1.0 K/uL   Eosinophils Relative 0  0 - 5 %   Eosinophils Absolute 0.0  0.0 - 0.7 K/uL   Basophils Relative 0  0 - 1 %   Basophils Absolute 0.0  0.0 - 0.1 K/uL  BASIC METABOLIC PANEL     Status: Abnormal   Collection Time    08/08/13  3:15 AM      Result Value Ref Range   Sodium 140  137 - 147 mEq/L   Potassium 4.5  3.7 - 5.3 mEq/L   Chloride 97  96 -  112 mEq/L   CO2 30  19 - 32 mEq/L   Glucose, Bld 216 (*) 70 - 99 mg/dL   BUN 37 (*) 6 - 23 mg/dL   Creatinine, Ser 1.12 (*) 0.50 - 1.10 mg/dL   Calcium 9.6  8.4 - 10.5 mg/dL   GFR calc non Af Amer 44 (*) >90 mL/min   GFR calc Af Amer 51 (*) >90 mL/min   Comment: (NOTE)     The eGFR has been calculated using the CKD EPI equation.     This calculation has not been validated in all clinical situations.     eGFR's persistently <90 mL/min signify possible Chronic Kidney     Disease.  GLUCOSE, CAPILLARY     Status: Abnormal   Collection Time    08/08/13  8:05 AM      Result Value Ref Range   Glucose-Capillary 208 (*) 70 - 99 mg/dL  GLUCOSE, CAPILLARY     Status: Abnormal   Collection Time    08/08/13  1:04 PM      Result Value Ref Range   Glucose-Capillary 206 (*) 70 - 99 mg/dL  GLUCOSE, CAPILLARY     Status: Abnormal   Collection Time    08/08/13  4:59 PM      Result Value Ref Range   Glucose-Capillary 259 (*) 70 - 99 mg/dL  GLUCOSE, CAPILLARY     Status: Abnormal   Collection Time    08/08/13  9:58 PM      Result Value Ref Range   Glucose-Capillary 288 (*) 70 - 99 mg/dL  PRO B NATRIURETIC PEPTIDE     Status: Abnormal   Collection Time    08/09/13  7:45 AM       Result Value Ref Range   Pro B Natriuretic peptide (BNP) 820.4 (*) 0 - 450 pg/mL  GLUCOSE, CAPILLARY     Status: Abnormal   Collection Time    08/09/13  7:47 AM      Result Value Ref Range   Glucose-Capillary 245 (*) 70 - 99 mg/dL  GLUCOSE, CAPILLARY     Status: Abnormal   Collection Time    08/09/13 12:44 PM      Result Value Ref Range   Glucose-Capillary 288 (*) 70 - 99 mg/dL    Studies/Results: No results found. Medications: Scheduled Meds: . antiseptic oral rinse  15 mL Mouth Rinse BID  . aspirin  325 mg Oral Daily  . benzonatate  100 mg Oral TID  . bisacodyl  5 mg Oral QHS  . cholecalciferol  2,000 Units Oral Daily  . citalopram  20 mg Oral Daily  . enoxaparin (LOVENOX) injection  40 mg Subcutaneous Q24H  . guaiFENesin  600 mg Oral BID  . hydrochlorothiazide  25 mg Oral Daily  . hydrocortisone   Rectal BID  . insulin aspart  0-5 Units Subcutaneous QHS  . insulin aspart  0-9 Units Subcutaneous TID WC  . levalbuterol  0.63 mg Nebulization Q4H   And  . ipratropium  0.5 mg Nebulization Q4H  . irbesartan  150 mg Oral Daily  . levofloxacin (LEVAQUIN) IV  750 mg Intravenous Q48H  . loratadine  10 mg Oral Daily  . methylPREDNISolone (SOLU-MEDROL) injection  60 mg Intravenous 3 times per day  . mometasone-formoterol  2 puff Inhalation BID  . polyethylene glycol  17 g Oral Daily  . pramipexole  0.25 mg Oral QHS  . senna-docusate  1 tablet Oral BID  . temazepam  15 mg  Oral QHS,MR X 1   Continuous Infusions: . sodium chloride 10 mL/hr at 08/09/13 0530   PRN Meds:.acetaminophen, acetaminophen, guaiFENesin-dextromethorphan, ibuprofen, levalbuterol, ondansetron (ZOFRAN) IV, ondansetron  Assessment/Plan: Principal Problem:  COPD exacerbation - symptomatically better today.  Slight Medrol was increased to every 6 hours and we will reduce to every 8 hours approximately or 3 times daily. Active Problems:  Respiratory failure, acute-on-chronic.   DM (diabetes mellitus), type  2, uncontrolled - continue usual medications and increase sliding scale insulin  HTN (hypertension) - controlled  Hyperlipidemia - aware  Restless legs syndrome - aware  History of NSTEMI - aware Unspecified constipation - try Dulcolax tablets as well as Senokot S. twice daily and daily MiraLAX Hemorrhoids - treat with hydrocortisone cream Patient is a no CODE BLUE  Palliative care to me with daughter and patient this afternoon.  Physical therapy was started today to see how she does  Disposition: Hopefully home in 3-4 days,  probably Monday,  with either hospice or not depending on patient's interaction with palliative care/hospice team and if she improves clinically.  Activity has become more exhausting as of late. Does not want placement in a long-term care facility if at all possible    LOS: 3 days   Henrine Screws, MD 08/09/2013, 3:44 PM

## 2013-08-09 NOTE — Consult Note (Signed)
Palliative Medicine Team at [facility]  Date: 08/09/2013   Patient Name: [name]  DOB: Apr 28, 1927  MRN: [id number]  Age / Sex: 80 y.o., female   PCP: Sierra Huddle, MD Referring Physician: Josetta Huddle, MD  HPI/Reason for Consultation: 80 yo woman with advanced COPD, on home O2 at 2L admitted with acute on chronic respiratory failure on 08/06/2013. Prior to admission she had several days of cough, fatigue and exertional dyspnea without relief with nebulizer's. She has managed to stay out of the hospital for the past year under the close supervision and management of her outpatient primary care team and has maintained her independence at home. She endorses that her disease had progressed and she now needs a walker or "walking stick to get around".  She tells me she is very limited in her functional status, especially after hospitalization last year 3/14 with severe pneumonia. This admission has been complicated by deconditioning, refractory symptoms and possible progression to right heart failure with new peripheral edema in her lower extremities and right atrial enlargement on EKG. [name] has been having intense episodes of "air hunger" and burning in her chest with minimal exertion- she worked with PT today and had a difficult time. [name] also shared with me that her best friend and close neighbor "[name]" died almost one week ago- she was quite tearful and anxious and is worried about returning home without her friend close by and fears loneliness. Her daughter  [name] is a Marine scientist and the Interior and spatial designer of [place] and is a strong and close support to her mother.    Palliative team consulted to assist with goals of care and possible hospice options.  Participants in Discussion: Patient and her daughter [name]  Summary of Goals of Care/Recommendations:  1. Code Status:  DNR  2. Scope of Treatment:  Treatment of reversible conditions  Minimize painful or invasive  interventions  Focus on comfort and quality of life as primary goals  Maintain her independence for as long as possible  Avoid SNF care now and in the future  Maximize medical therapies for her COPD  Initiate Palliative therapies for Dyspnea and Air Hunger  Agreeable to [place] if eligible  3. Psychosocial Spiritual Asssessment/Interventions: Patient clearly having understated grief from the recent loss of her best friend and neighbor-she is extremely worried about returning home without her being there. She would benefit from counseling- she is an extroverted and outgoing lady who enjoys the company of others- loneliness could be detrimental to her wellbeing. Will ask chaplain to see her for support.  Assessment/Plan:  Primary Diagnoses  1. COPD, no recent PFT's in Epic, clinical diagnosis 2. Congestive Heart Failure, acute on Chronic-elevated BNP >800, EF 40% on Echo in 06/2012 3. Right Atrial Enlargement and Peripheral edema  Prognosis: <6 months  For purposes of prognostication and in determining hospice eligibility it would be reasonable to obtain a recent 2D Echo.   PPS 50%    Active Symptoms 1. Dyspnea 2. Anxiety 3. Grief, acute 4. Fatigue 5. Poor Appetite 6. Insomnia 7. Constipation   Will make Hospice Referral  Start low dose Roxanol prn for air hunger and dyspnea- I have discussed the careful use of low dose morphine for dyspnea in detail with patient.  Low dose alprazolam for air hunger, acute grief and anxiety.  Titrate up her SSRI or consider switching if not effective  Maintain aggressive bowel regimen   Counseling referral/Spiritual Care    Disposition Recommendations: Based  on my current assessment and how she appears clinically I think a hospice referral would be appropriate and I have discussed this in detail with Ms. Grade and her daughter- I discussed the hospice philosophy of comfort and QOL and also that COPD and CHF both carry a  high level of prognostic uncertainty.   Time: 90 minutes Time In: 4:45 Time Out: 6PM Greater than 50%  of this time was spent counseling and coordinating care related to the above assessment and plan.  Signed by: Acquanetta Chain, DO  08/09/2013, 5:56 PM  Please contact Palliative Medicine Team phone at [phone] for questions and concerns.

## 2013-08-10 LAB — CULTURE, RESPIRATORY

## 2013-08-10 LAB — GLUCOSE, CAPILLARY
Glucose-Capillary: 229 mg/dL — ABNORMAL HIGH (ref 70–99)
Glucose-Capillary: 300 mg/dL — ABNORMAL HIGH (ref 70–99)
Glucose-Capillary: 300 mg/dL — ABNORMAL HIGH (ref 70–99)
Glucose-Capillary: 130 mg/dL — ABNORMAL HIGH (ref 70–99)

## 2013-08-10 LAB — CULTURE, RESPIRATORY W GRAM STAIN

## 2013-08-10 MED ORDER — MORPHINE SULFATE (CONCENTRATE) 10 MG /0.5 ML PO SOLN
5.0000 mg | ORAL | Status: DC | PRN
  Filled 2013-08-10: qty 0.5

## 2013-08-10 MED ORDER — LEVALBUTEROL HCL 0.63 MG/3ML IN NEBU: 0.6300 mg | INHALATION_SOLUTION | RESPIRATORY_TRACT | Status: DC

## 2013-08-10 MED ORDER — METHYLPREDNISOLONE SODIUM SUCC 125 MG IJ SOLR: 60.0000 mg | Freq: Two times a day (BID) | INTRAMUSCULAR | Status: DC

## 2013-08-10 MED ORDER — TEMAZEPAM 15 MG PO CAPS
15.0000 mg | ORAL_CAPSULE | Freq: Once | ORAL | Status: AC
  Administered 2013-08-10: 15 mg via ORAL

## 2013-08-10 MED ORDER — INSULIN GLARGINE 100 UNIT/ML ~~LOC~~ SOLN: 15.0000 [IU] | Freq: Every day | SUBCUTANEOUS | Status: DC

## 2013-08-10 MED ORDER — IPRATROPIUM BROMIDE 0.02 % IN SOLN
0.5000 mg | Freq: Four times a day (QID) | RESPIRATORY_TRACT | Status: DC
  Administered 2013-08-10 – 2013-08-13 (×15): 0.5 mg via RESPIRATORY_TRACT
  Filled 2013-08-10 (×15): qty 2.5

## 2013-08-10 MED ORDER — INSULIN ASPART 100 UNIT/ML ~~LOC~~ SOLN: 0.0000 [IU] | Freq: Every day | SUBCUTANEOUS | Status: DC

## 2013-08-10 MED ORDER — PREDNISONE 50 MG PO TABS
50.0000 mg | ORAL_TABLET | Freq: Every day | ORAL | Status: DC
  Administered 2013-08-12 – 2013-08-13 (×2): 50 mg via ORAL
  Filled 2013-08-10 (×3): qty 1

## 2013-08-10 MED ORDER — LEVALBUTEROL HCL 0.63 MG/3ML IN NEBU
0.6300 mg | INHALATION_SOLUTION | Freq: Four times a day (QID) | RESPIRATORY_TRACT | Status: DC
  Administered 2013-08-10 – 2013-08-13 (×15): 0.63 mg via RESPIRATORY_TRACT
  Filled 2013-08-10 (×30): qty 3

## 2013-08-10 MED ORDER — INSULIN GLARGINE 100 UNIT/ML ~~LOC~~ SOLN
15.0000 [IU] | Freq: Every day | SUBCUTANEOUS | Status: DC
Start: 2013-08-10 — End: 2013-08-13
  Administered 2013-08-11 – 2013-08-12 (×3): 15 [IU] via SUBCUTANEOUS
  Filled 2013-08-10 (×5): qty 0.15

## 2013-08-10 MED ORDER — INSULIN ASPART 100 UNIT/ML ~~LOC~~ SOLN
0.0000 [IU] | Freq: Three times a day (TID) | SUBCUTANEOUS | Status: DC
  Administered 2013-08-10 – 2013-08-11 (×3): 11 [IU] via SUBCUTANEOUS
  Administered 2013-08-11: 4 [IU] via SUBCUTANEOUS
  Administered 2013-08-11: 20 [IU] via SUBCUTANEOUS
  Administered 2013-08-12: 3 [IU] via SUBCUTANEOUS
  Administered 2013-08-12: 11 [IU] via SUBCUTANEOUS
  Administered 2013-08-12: 3 [IU] via SUBCUTANEOUS
  Administered 2013-08-13: 11 [IU] via SUBCUTANEOUS

## 2013-08-10 MED ORDER — METHYLPREDNISOLONE SODIUM SUCC 125 MG IJ SOLR
60.0000 mg | Freq: Two times a day (BID) | INTRAMUSCULAR | Status: AC
  Administered 2013-08-10 – 2013-08-11 (×2): 60 mg via INTRAVENOUS
  Filled 2013-08-10 (×2): qty 0.96

## 2013-08-10 MED ORDER — ALPRAZOLAM 0.25 MG PO TABS
0.2500 mg | ORAL_TABLET | Freq: Three times a day (TID) | ORAL | Status: DC | PRN
  Administered 2013-08-10: 0.25 mg via ORAL

## 2013-08-10 MED ORDER — IPRATROPIUM BROMIDE 0.02 % IN SOLN: 0.5000 mg | Freq: Four times a day (QID) | RESPIRATORY_TRACT | Status: DC

## 2013-08-10 NOTE — Progress Notes (Signed)
Inpatient Diabetes Program Recommendations  AACE/ADA: New Consensus Statement on Inpatient Glycemic Control (2013)  Target Ranges:  Prepandial:   less than 140 mg/dL      Peak postprandial:   less than 180 mg/dL (1-2 hours)      Critically ill patients:  140 - 180 mg/dL    Results for EDNA, REDE (MRN [id number]) as of 08/10/2013 06:33  Ref. Range 08/09/2013 07:47 08/09/2013 12:44 08/09/2013 17:40 08/09/2013 22:26  Glucose-Capillary Latest Range: 70-99 mg/dL 245 (H) 288 (H) 293 (H) 255 (H)      Patient currently receiving IV Solumedrol 60 mg Q8 hours.   Current insulin orders are for Novolog Sensitive SSI tid ac + HS.    MD- Please consider the following insulin adjustments while pt on IV steroids if within patient's goals of care:  1. Add basal insulin- Lantus 15 units daily (0.2 units/kg)  2. Increase SSI to Moderate scale tid ac + HS   Will follow Wyn Quaker RN, MSN, CDE Diabetes Coordinator Inpatient Diabetes Program Team Pager: [phone] (8a-10p)

## 2013-08-10 NOTE — ED Provider Notes (Signed)
Medical screening examination/treatment/procedure(s) were conducted as a shared visit with non-physician practitioner(s) and myself.  I personally evaluated the patient during the encounter.   EKG Interpretation   Date/Time:  Monday August 06 2013 13:02:20 EDT Ventricular Rate:  101 PR Interval:  174 QRS Duration: 78 QT Interval:  346 QTC Calculation: 448 R Axis:   81 Text Interpretation:  Sinus tachycardia with frequent Premature  ventricular complexes Right atrial enlargement Nonspecific ST abnormality  Abnormal ECG Confirmed by Zenia Resides  MD, Jonatha Gagen ([id number]) on 08/06/2013 1:37:47  PM       Leota Jacobsen, MD 08/10/13 1426

## 2013-08-10 NOTE — Care Management Note (Signed)
    Page 1 of 2   08/13/2013     3:19:14 PM CARE MANAGEMENT NOTE 08/13/2013  Patient:  [name], [name]   Account Number:  [id number]  Date Initiated:  08/10/2013  Documentation initiated by:  Tomi Bamberger  Subjective/Objective Assessment:   dx copd  admit- from home.     Action/Plan:   4/23- palliative meeting, - rec home with hospice. chose [facility].   Anticipated DC Date:  08/13/2013   Anticipated DC Plan:  [place]  CM consult      PAC Choice  HOSPICE   Choice offered to / List presented to:  C-4 Adult Children   DME arranged  [facility]      DME agency  [place] arranged  HH-1 RN      [facility] agency  [facility] [place]   Status of service:  Completed, signed off Medicare Important Message given?   (If response is "NO", the following Medicare IM given date fields will be blank) Date Medicare IM given:   Date Additional Medicare IM given:    Discharge Disposition:  [place]  Per UR Regulation:  Reviewed for med. necessity/level of care/duration of stay  If discussed at [place] of Stay Meetings, dates discussed:    Comments:  08/13/13 [place], BSN [phone] patient is for dc today, home with hospice [facility],  she will need oxygen tank, which [facility] has brought up to patient's room already and she will be transported by car.  08/10/13 [place], BSN [phone] patient is from home, palliative care meeting on 4/23 recs home with hospice, NCM spoke with daughter, [name] [phone] and offered choice, she chose [facility], referral made to [facility] at [phone] and NCM also left vmail with Danton Sewer with [facility].  Daughter states patient has Ross rollator , rolling walker, transport chair and oxygen which is with [facility]. Patient may be for possible dc on w/end or Monday.

## 2013-08-10 NOTE — Progress Notes (Signed)
Chaplain responded to palliative care consult. Pt stated she is doing well and that her son-in-law is a Nurse, learning disability priest who visits her everyday. She is also close to her daughter, who visits daily as well. Pt seems to have lots of support but did share a little about the recent death of a friend. She seems accepting of prognosis, laughing and stating, "you have to have a sense of humor about these things." Chaplain helped pt process feelings and provided emotional support. Will follow up as necessary.

## 2013-08-10 NOTE — Progress Notes (Signed)
Subjective: Slow improvement. Appreciated consultation with palliative care yesterday. Anusol HC is helping her perianal pain.  Objective: Weight change:   Intake/Output Summary (Last 24 hours) at 08/10/13 1347 Last data filed at 08/10/13 1316  Gross per 24 hour  Intake    420 ml  Output   1400 ml  Net   -980 ml   Filed Vitals:   08/10/13 0659 08/10/13 0750 08/10/13 1316 08/10/13 1323  BP: 161/84  125/55   Pulse: 78  80   Temp: 97.6 F (36.4 C)  98.3 F (36.8 C)   TempSrc: Oral  Oral   Resp: 18  20   Height:      Weight:      SpO2: 97% 97% 98% 96%    General Appearance: Alert, cooperative, mildly increased work of breathing, chronic  Lungs: Distant breath sounds throughout with end expiratory wheeze  Heart: Regular rate and rhythm, S1 and S2 normal, no murmur, rub or gallop  Abdomen: Soft, non-tender, bowel sounds active all four quadrants, no masses, no organomegaly  Extremities: Extremities normal, atraumatic, no cyanosis or edema  Neuro: Nonfocal, alert  Lab Results: Results for orders placed during the hospital encounter of 08/06/13 (from the past 48 hour(s))  GLUCOSE, CAPILLARY     Status: Abnormal   Collection Time    08/08/13  4:59 PM      Result Value Ref Range   Glucose-Capillary 259 (*) 70 - 99 mg/dL  GLUCOSE, CAPILLARY     Status: Abnormal   Collection Time    08/08/13  9:58 PM      Result Value Ref Range   Glucose-Capillary 288 (*) 70 - 99 mg/dL  PRO B NATRIURETIC PEPTIDE     Status: Abnormal   Collection Time    08/09/13  7:45 AM      Result Value Ref Range   Pro B Natriuretic peptide (BNP) 820.4 (*) 0 - 450 pg/mL  GLUCOSE, CAPILLARY     Status: Abnormal   Collection Time    08/09/13  7:47 AM      Result Value Ref Range   Glucose-Capillary 245 (*) 70 - 99 mg/dL  GLUCOSE, CAPILLARY     Status: Abnormal   Collection Time    08/09/13 12:44 PM      Result Value Ref Range   Glucose-Capillary 288 (*) 70 - 99 mg/dL  GLUCOSE, CAPILLARY     Status:  Abnormal   Collection Time    08/09/13  5:40 PM      Result Value Ref Range   Glucose-Capillary 293 (*) 70 - 99 mg/dL  GLUCOSE, CAPILLARY     Status: Abnormal   Collection Time    08/09/13 10:26 PM      Result Value Ref Range   Glucose-Capillary 255 (*) 70 - 99 mg/dL  GLUCOSE, CAPILLARY     Status: Abnormal   Collection Time    08/10/13  7:40 AM      Result Value Ref Range   Glucose-Capillary 229 (*) 70 - 99 mg/dL  GLUCOSE, CAPILLARY     Status: Abnormal   Collection Time    08/10/13 12:12 PM      Result Value Ref Range   Glucose-Capillary 300 (*) 70 - 99 mg/dL    Studies/Results: No results found. Medications: Scheduled Meds: . antiseptic oral rinse  15 mL Mouth Rinse BID  . aspirin  325 mg Oral Daily  . benzonatate  100 mg Oral TID  . bisacodyl  5 mg Oral  QHS  . cholecalciferol  2,000 Units Oral Daily  . citalopram  20 mg Oral Daily  . enoxaparin (LOVENOX) injection  40 mg Subcutaneous Q24H  . guaiFENesin  600 mg Oral BID  . hydrochlorothiazide  25 mg Oral Daily  . hydrocortisone   Rectal BID  . insulin aspart  0-20 Units Subcutaneous TID WC  . insulin aspart  0-5 Units Subcutaneous QHS  . insulin glargine  15 Units Subcutaneous QHS  . levalbuterol  0.63 mg Nebulization QID   And  . ipratropium  0.5 mg Nebulization QID  . irbesartan  150 mg Oral Daily  . levofloxacin (LEVAQUIN) IV  750 mg Intravenous Q48H  . loratadine  10 mg Oral Daily  . methylPREDNISolone (SOLU-MEDROL) injection  60 mg Intravenous Q12H  . mometasone-formoterol  2 puff Inhalation BID  . polyethylene glycol  17 g Oral Daily  . pramipexole  0.25 mg Oral QHS  . [START ON 08/12/2013] predniSONE  50 mg Oral Q breakfast  . senna-docusate  1 tablet Oral BID  . temazepam  15 mg Oral QHS,MR X 1   Continuous Infusions: . sodium chloride 10 mL/hr at 08/09/13 0530   PRN Meds:.acetaminophen, acetaminophen, ALPRAZolam, ALPRAZolam, levalbuterol, morphine CONCENTRATE, morphine CONCENTRATE, ondansetron  (ZOFRAN) IV, ondansetron  Assessment/Plan:  Principal Problem:  COPD exacerbation - symptomatically better today. Solu-Medrol will be reduced to every 12 hours and we will start oral prednisone on Sunday  Active Problems:  Respiratory failure, acute-on-chronic.  DM (diabetes mellitus), type 2, uncontrolled - continue usual medications and increase sliding scale insulin  HTN (hypertension) - controlled  Hyperlipidemia - aware  Restless legs syndrome - aware  History of NSTEMI - aware  Unspecified constipation - continue Dulcolax tablets as well as Senokot S. twice daily and daily MiraLAX  Hemorrhoids - treat with hydrocortisone cream  Patient is a no CODE BLUE  Palliative care consult very helpful. Oral morphine and alprazolam started when necessary  Physical therapy as tolerated Disposition: Hopefully home April 27 - Monday Activity has become more exhausting as of late. Does not want placement in a long-term care facility if at all possible     LOS: 4 days   Henrine Screws, MD 08/10/2013, 1:47 PM

## 2013-08-11 LAB — CBC WITH DIFFERENTIAL/PLATELET
BASOS PCT: 0 % (ref 0–1)
Basophils Absolute: 0 10*3/uL (ref 0.0–0.1)
EOS ABS: 0 10*3/uL (ref 0.0–0.7)
EOS PCT: 0 % (ref 0–5)
HCT: 34.4 % — ABNORMAL LOW (ref 36.0–46.0)
Hemoglobin: 10.7 g/dL — ABNORMAL LOW (ref 12.0–15.0)
Lymphocytes Relative: 8 % — ABNORMAL LOW (ref 12–46)
Lymphs Abs: 1.7 10*3/uL (ref 0.7–4.0)
MCH: 27.6 pg (ref 26.0–34.0)
MCHC: 31.1 g/dL (ref 30.0–36.0)
MCV: 88.9 fL (ref 78.0–100.0)
MONO ABS: 1 10*3/uL (ref 0.1–1.0)
Monocytes Relative: 5 % (ref 3–12)
Neutro Abs: 18 10*3/uL — ABNORMAL HIGH (ref 1.7–7.7)
Neutrophils Relative %: 87 % — ABNORMAL HIGH (ref 43–77)
Platelets: 351 10*3/uL (ref 150–400)
RBC: 3.87 MIL/uL (ref 3.87–5.11)
RDW: 14.4 % (ref 11.5–15.5)
WBC: 20.7 10*3/uL — ABNORMAL HIGH (ref 4.0–10.5)

## 2013-08-11 LAB — GLUCOSE, CAPILLARY
Glucose-Capillary: 173 mg/dL — ABNORMAL HIGH (ref 70–99)
Glucose-Capillary: 254 mg/dL — ABNORMAL HIGH (ref 70–99)
Glucose-Capillary: 414 mg/dL — ABNORMAL HIGH (ref 70–99)
Glucose-Capillary: 290 mg/dL — ABNORMAL HIGH (ref 70–99)

## 2013-08-11 LAB — BASIC METABOLIC PANEL
BUN: 36 mg/dL — AB (ref 6–23)
CHLORIDE: 94 meq/L — AB (ref 96–112)
CO2: 33 meq/L — AB (ref 19–32)
CREATININE: 0.79 mg/dL (ref 0.50–1.10)
Calcium: 9.7 mg/dL (ref 8.4–10.5)
GFR calc Af Amer: 86 mL/min — ABNORMAL LOW (ref >90)
GFR calc non Af Amer: 74 mL/min — ABNORMAL LOW (ref >90)
Glucose, Bld: 210 mg/dL — ABNORMAL HIGH (ref 70–99)
Potassium: 3.9 mEq/L (ref 3.7–5.3)
Sodium: 139 mEq/L (ref 137–147)

## 2013-08-11 LAB — GLUCOSE, RANDOM: GLUCOSE: 357 mg/dL — AB (ref 70–99)

## 2013-08-11 MED ORDER — INSULIN ASPART 100 UNIT/ML ~~LOC~~ SOLN: 20.0000 [IU] | Freq: Once | SUBCUTANEOUS | Status: DC

## 2013-08-11 NOTE — Progress Notes (Signed)
Subjective:  patient without any new complaints. No fever no chills.  Objective: Vital signs in last 24 hours: Temp:  [97.3 F (36.3 C)-98.3 F (36.8 C)] 97.3 F (36.3 C) (04/25 0518) Pulse Rate:  [68-80] 79 (04/25 0518) Resp:  [18-20] 18 (04/25 0518) BP: (125-173)/(55-77) 173/77 mmHg (04/25 0518) SpO2:  [96 %-99 %] 98 % (04/25 0518) Weight change:  Last BM Date: 08/10/13  Intake/Output from previous day: 04/24 0701 - 04/25 0700 In: 720 [P.O.:720] Out: 900 [Urine:900] Intake/Output this shift: Total I/O In: -  Out: 300 [Urine:300]  General appearance: alert and cooperative Resp: Wheezing bilateral, fair air movement, Cardio: regular rate and rhythm Extremities: extremities normal, atraumatic, no cyanosis or edema  Lab Results:  Results for orders placed during the hospital encounter of 08/06/13 (from the past 24 hour(s))  GLUCOSE, CAPILLARY     Status: Abnormal   Collection Time    08/10/13 12:12 PM      Result Value Ref Range   Glucose-Capillary 300 (*) 70 - 99 mg/dL  GLUCOSE, CAPILLARY     Status: Abnormal   Collection Time    08/10/13  5:21 PM      Result Value Ref Range   Glucose-Capillary 300 (*) 70 - 99 mg/dL  GLUCOSE, CAPILLARY     Status: Abnormal   Collection Time    08/10/13 10:48 PM      Result Value Ref Range   Glucose-Capillary 130 (*) 70 - 99 mg/dL   Comment 1 Notify RN     Comment 2 Documented in Chart    CBC WITH DIFFERENTIAL     Status: Abnormal   Collection Time    08/11/13  6:35 AM      Result Value Ref Range   WBC 20.7 (*) 4.0 - 10.5 K/uL   RBC 3.87  3.87 - 5.11 MIL/uL   Hemoglobin 10.7 (*) 12.0 - 15.0 g/dL   HCT 34.4 (*) 36.0 - 46.0 %   MCV 88.9  78.0 - 100.0 fL   MCH 27.6  26.0 - 34.0 pg   MCHC 31.1  30.0 - 36.0 g/dL   RDW 14.4  11.5 - 15.5 %   Platelets 351  150 - 400 K/uL   Neutrophils Relative % 87 (*) 43 - 77 %   Lymphocytes Relative 8 (*) 12 - 46 %   Monocytes Relative 5  3 - 12 %   Eosinophils Relative 0  0 - 5 %   Basophils Relative 0  0 - 1 %   Neutro Abs 18.0 (*) 1.7 - 7.7 K/uL   Lymphs Abs 1.7  0.7 - 4.0 K/uL   Monocytes Absolute 1.0  0.1 - 1.0 K/uL   Eosinophils Absolute 0.0  0.0 - 0.7 K/uL   Basophils Absolute 0.0  0.0 - 0.1 K/uL   RBC Morphology BASOPHILIC STIPPLING     WBC Morphology MILD LEFT SHIFT (1-5% METAS, OCC MYELO, OCC BANDS)    BASIC METABOLIC PANEL     Status: Abnormal   Collection Time    08/11/13  6:35 AM      Result Value Ref Range   Sodium 139  137 - 147 mEq/L   Potassium 3.9  3.7 - 5.3 mEq/L   Chloride 94 (*) 96 - 112 mEq/L   CO2 33 (*) 19 - 32 mEq/L   Glucose, Bld 210 (*) 70 - 99 mg/dL   BUN 36 (*) 6 - 23 mg/dL   Creatinine, Ser 0.79  0.50 - 1.10 mg/dL  Calcium 9.7  8.4 - 10.5 mg/dL   GFR calc non Af Amer 74 (*) >90 mL/min   GFR calc Af Amer 86 (*) >90 mL/min  GLUCOSE, CAPILLARY     Status: Abnormal   Collection Time    08/11/13  8:00 AM      Result Value Ref Range   Glucose-Capillary 173 (*) 70 - 99 mg/dL      Studies/Results: No results found.  Medications:  Prior to Admission:  Prescriptions prior to admission  Medication Sig Dispense Refill  . acetaminophen (TYLENOL) 325 MG tablet Take 2 tablets (650 mg total) by mouth every 6 (six) hours as needed for pain or fever.  30 tablet  1  . albuterol (PROVENTIL) (2.5 MG/3ML) 0.083% nebulizer solution Take 2.5 mg by nebulization every 6 (six) hours as needed for wheezing or shortness of breath.      Marland Kitchen aspirin 325 MG tablet Take 325 mg by mouth daily.      . budesonide (PULMICORT) 180 MCG/ACT inhaler Inhale 2 puffs into the lungs 2 (two) times daily.      . Cholecalciferol (VITAMIN D) 2000 UNITS CAPS Take 1 capsule by mouth daily.      . citalopram (CELEXA) 20 MG tablet Take 20 mg by mouth daily.       Marland Kitchen doxepin (SINEQUAN) 10 MG capsule Take 10 mg by mouth.      . Fluticasone-Salmeterol (ADVAIR DISKUS) 250-50 MCG/DOSE AEPB Inhale 1 puff into the lungs every 12 (twelve) hours.      Marland Kitchen guaiFENesin (MUCINEX) 600 MG  12 hr tablet Take 600 mg by mouth 2 (two) times daily.      . Ibuprofen (ADVIL PO) Take 2 tablets by mouth every 4 (four) hours as needed (pain).      Marland Kitchen loratadine (CLARITIN) 10 MG tablet Take 10 mg by mouth daily.      . metFORMIN (GLUCOPHAGE) 500 MG tablet Take 500 mg by mouth 2 (two) times daily with a meal.      . polyethylene glycol (MIRALAX / GLYCOLAX) packet Take 17 g by mouth daily.  7 each  0  . pramipexole (MIRAPEX) 0.125 MG tablet Take 0.25 mg by mouth at bedtime.       . simvastatin (ZOCOR) 40 MG tablet Take 40 mg by mouth every evening.      . temazepam (RESTORIL) 15 MG capsule Take 1 capsule (15 mg total) by mouth at bedtime and may repeat dose one time if needed.  10 capsule  0  . valsartan-hydrochlorothiazide (DIOVAN-HCT) 160-25 MG per tablet Take 1 tablet by mouth daily.        Scheduled: . antiseptic oral rinse  15 mL Mouth Rinse BID  . aspirin  325 mg Oral Daily  . benzonatate  100 mg Oral TID  . bisacodyl  5 mg Oral QHS  . cholecalciferol  2,000 Units Oral Daily  . citalopram  20 mg Oral Daily  . enoxaparin (LOVENOX) injection  40 mg Subcutaneous Q24H  . guaiFENesin  600 mg Oral BID  . hydrochlorothiazide  25 mg Oral Daily  . hydrocortisone   Rectal BID  . insulin aspart  0-20 Units Subcutaneous TID WC  . insulin aspart  0-5 Units Subcutaneous QHS  . insulin glargine  15 Units Subcutaneous QHS  . levalbuterol  0.63 mg Nebulization QID   And  . ipratropium  0.5 mg Nebulization QID  . irbesartan  150 mg Oral Daily  . levofloxacin (LEVAQUIN) IV  750 mg  Intravenous Q48H  . loratadine  10 mg Oral Daily  . methylPREDNISolone (SOLU-MEDROL) injection  60 mg Intravenous Q12H  . mometasone-formoterol  2 puff Inhalation BID  . polyethylene glycol  17 g Oral Daily  . pramipexole  0.25 mg Oral QHS  . [START ON 08/12/2013] predniSONE  50 mg Oral Q breakfast  . senna-docusate  1 tablet Oral BID  . temazepam  15 mg Oral QHS,MR X 1   Continuous: . sodium chloride 10 mL/hr at  08/09/13 0530   [facility]:ENIDPOEUMPNTI, acetaminophen, ALPRAZolam, ALPRAZolam, levalbuterol, morphine CONCENTRATE, morphine CONCENTRATE, ondansetron (ZOFRAN) IV, ondansetron  Assessment/Plan: Principal Problem:  COPD exacerbation -  patient still with significant wheezing on exam, continue treatment as outlined Active Problems:  Respiratory failure, acute-on-chronic.  DM (diabetes mellitus), type 2, uncontrolled - partially steroid related  HTN (hypertension) - BP elevated this morning, patient asymptomatic Hyperlipidemia - aware  Restless legs syndrome - aware  History of NSTEMI - aware  Unspecified constipation - try Dulcolax tablets as well as Senokot S. twice daily and daily MiraLAX  Hemorrhoids - treat with hydrocortisone cream  Patient is a no CODE BLUE  Palliative care, input appreciated Leukocytosis more than likely steroid related    LOS: 5 days   Kandice Hams 08/11/2013, 8:54 AM

## 2013-08-12 LAB — GLUCOSE, CAPILLARY
GLUCOSE-CAPILLARY: 141 mg/dL — AB (ref 70–99)
Glucose-Capillary: 147 mg/dL — ABNORMAL HIGH (ref 70–99)
Glucose-Capillary: 259 mg/dL — ABNORMAL HIGH (ref 70–99)
Glucose-Capillary: 329 mg/dL — ABNORMAL HIGH (ref 70–99)

## 2013-08-12 NOTE — Progress Notes (Signed)
Palliative Care Team at [facility] Note   SUBJECTIVE: Improved. Now on 3L [place]. She has taken 2 doses of her PRN meds for anxiety and air hunger and tolerated them well. She is able to ambulate into the hallway and back to her chair, but she reports this was associated with significant effort and dyspnea. Denies pain. She thinks she is ready to go home and will "be ok".  OBJECTIVE: Vital Signs: BP 114/67  Pulse 102  Temp(Src) 97.5 F (36.4 C) (Oral)  Resp 20  Ht 5\' 4"  (1.626 m)  Wt 77.792 kg (171 lb 8 oz)  BMI 29.42 kg/m2  SpO2 98%   Intake and Output: 04/25 0701 - 04/26 0700 In: 222 [P.O.:222] Out: 300 [Urine:300] No Known Allergies  Medications: Scheduled Meds:  . antiseptic oral rinse  15 mL Mouth Rinse BID  . aspirin  325 mg Oral Daily  . benzonatate  100 mg Oral TID  . bisacodyl  5 mg Oral QHS  . cholecalciferol  2,000 Units Oral Daily  . citalopram  20 mg Oral Daily  . enoxaparin (LOVENOX) injection  40 mg Subcutaneous Q24H  . guaiFENesin  600 mg Oral BID  . hydrochlorothiazide  25 mg Oral Daily  . hydrocortisone   Rectal BID  . insulin aspart  0-20 Units Subcutaneous TID WC  . insulin aspart  0-5 Units Subcutaneous QHS  . insulin aspart  20 Units Subcutaneous Once  . insulin glargine  15 Units Subcutaneous QHS  . levalbuterol  0.63 mg Nebulization QID   And  . ipratropium  0.5 mg Nebulization QID  . irbesartan  150 mg Oral Daily  . levofloxacin (LEVAQUIN) IV  750 mg Intravenous Q48H  . loratadine  10 mg Oral Daily  . mometasone-formoterol  2 puff Inhalation BID  . polyethylene glycol  17 g Oral Daily  . pramipexole  0.25 mg Oral QHS  . predniSONE  50 mg Oral Q breakfast  . senna-docusate  1 tablet Oral BID  . temazepam  15 mg Oral QHS,MR X 1    Continuous Infusions: . sodium chloride 50 mL/hr at 08/12/13 2155    PRN Meds: acetaminophen, acetaminophen, ALPRAZolam, ALPRAZolam, levalbuterol, morphine CONCENTRATE, morphine CONCENTRATE, ondansetron  (ZOFRAN) IV, ondansetron   Labs: CBC    Component Value Date/Time   WBC 20.7* 08/11/2013 0635   RBC 3.87 08/11/2013 0635   HGB 10.7* 08/11/2013 0635   HCT 34.4* 08/11/2013 0635   PLT 351 08/11/2013 0635   MCV 88.9 08/11/2013 0635   MCH 27.6 08/11/2013 0635   MCHC 31.1 08/11/2013 0635   RDW 14.4 08/11/2013 0635   LYMPHSABS 1.7 08/11/2013 0635   MONOABS 1.0 08/11/2013 0635   EOSABS 0.0 08/11/2013 0635   BASOSABS 0.0 08/11/2013 0635    CMET     Component Value Date/Time   NA 139 08/11/2013 0635   K 3.9 08/11/2013 0635   CL 94* 08/11/2013 0635   CO2 33* 08/11/2013 0635   GLUCOSE 357* 08/11/2013 1809   BUN 36* 08/11/2013 0635   CREATININE 0.79 08/11/2013 0635   CALCIUM 9.7 08/11/2013 0635   PROT 6.9 06/24/2012 0600   ALBUMIN 3.1* 06/24/2012 0600   AST 81* 06/24/2012 0600   ALT 68* 06/24/2012 0600   ALKPHOS 111 06/24/2012 0600   BILITOT 0.2* 06/24/2012 0600   GFRNONAA 74* 08/11/2013 0635   GFRAA 86* 08/11/2013 0635    ASSESSMENT/ PLAN: 80 yo with advanced COPD and CHF with EF 40% admitted with acute respirtory failure  and PNA. Limited functional status. Goals are comfort and QOL.    Patient is agreeable to Hospice Referral on discharge  [facility] to evaluate Monday.  If need, patient's daughter can supplement hospice care with in home caregivers depending on what her mothers needs are when she gets home.  Maintain current PRN medications at discharge.    Time In: 5PM Time Out: 5:25 Total Time Spent with Patient: 25 MINUTES Total Overall Time: 30 MINUTES   Greater than 50%  of this time was spent counseling and coordinating care related to the above assessment and plan.   Acquanetta Chain, DO  08/12/2013, 10:39 PM  Please contact Palliative Medicine Team phone at [phone] for questions and concerns.

## 2013-08-12 NOTE — Progress Notes (Signed)
Subjective: Blood sugars are better this morning. Yesterday she ate cake for her grandson's birthday  Objective: Vital signs in last 24 hours: Temp:  [97.3 F (36.3 C)-97.7 F (36.5 C)] 97.3 F (36.3 C) (04/26 0526) Pulse Rate:  [73-106] 73 (04/26 0526) Resp:  [18-20] 18 (04/26 0526) BP: (134-157)/(69-86) 134/73 mmHg (04/26 0526) SpO2:  [96 %-100 %] 96 % (04/26 0757) Weight change:  Last BM Date: 08/09/13 (patient states this is normal)  Intake/Output from previous day: 04/25 0701 - 04/26 0700 In: -  Out: 300 [Urine:300] Intake/Output this shift:    General appearance: alert and cooperative Resp: Moderate air movement bilateral, wheezing bilateral Cardio: regular rate and rhythm Extremities: Trace edema  Lab Results:  Results for orders placed during the hospital encounter of 08/06/13 (from the past 24 hour(s))  GLUCOSE, CAPILLARY     Status: Abnormal   Collection Time    08/11/13 11:44 AM      Result Value Ref Range   Glucose-Capillary 254 (*) 70 - 99 mg/dL  GLUCOSE, CAPILLARY     Status: Abnormal   Collection Time    08/11/13  4:47 PM      Result Value Ref Range   Glucose-Capillary 414 (*) 70 - 99 mg/dL  GLUCOSE, RANDOM     Status: Abnormal   Collection Time    08/11/13  6:09 PM      Result Value Ref Range   Glucose, Bld 357 (*) 70 - 99 mg/dL  GLUCOSE, CAPILLARY     Status: Abnormal   Collection Time    08/11/13 11:00 PM      Result Value Ref Range   Glucose-Capillary 290 (*) 70 - 99 mg/dL  GLUCOSE, CAPILLARY     Status: Abnormal   Collection Time    08/12/13  7:48 AM      Result Value Ref Range   Glucose-Capillary 147 (*) 70 - 99 mg/dL      Studies/Results: No results found.  Medications:  Prior to Admission:  Prescriptions prior to admission  Medication Sig Dispense Refill  . acetaminophen (TYLENOL) 325 MG tablet Take 2 tablets (650 mg total) by mouth every 6 (six) hours as needed for pain or fever.  30 tablet  1  . albuterol (PROVENTIL) (2.5  MG/3ML) 0.083% nebulizer solution Take 2.5 mg by nebulization every 6 (six) hours as needed for wheezing or shortness of breath.      Marland Kitchen aspirin 325 MG tablet Take 325 mg by mouth daily.      . budesonide (PULMICORT) 180 MCG/ACT inhaler Inhale 2 puffs into the lungs 2 (two) times daily.      . Cholecalciferol (VITAMIN D) 2000 UNITS CAPS Take 1 capsule by mouth daily.      . citalopram (CELEXA) 20 MG tablet Take 20 mg by mouth daily.       Marland Kitchen doxepin (SINEQUAN) 10 MG capsule Take 10 mg by mouth.      . Fluticasone-Salmeterol (ADVAIR DISKUS) 250-50 MCG/DOSE AEPB Inhale 1 puff into the lungs every 12 (twelve) hours.      Marland Kitchen guaiFENesin (MUCINEX) 600 MG 12 hr tablet Take 600 mg by mouth 2 (two) times daily.      . Ibuprofen (ADVIL PO) Take 2 tablets by mouth every 4 (four) hours as needed (pain).      Marland Kitchen loratadine (CLARITIN) 10 MG tablet Take 10 mg by mouth daily.      . metFORMIN (GLUCOPHAGE) 500 MG tablet Take 500 mg by mouth 2 (two) times  daily with a meal.      . polyethylene glycol (MIRALAX / GLYCOLAX) packet Take 17 g by mouth daily.  7 each  0  . pramipexole (MIRAPEX) 0.125 MG tablet Take 0.25 mg by mouth at bedtime.       . simvastatin (ZOCOR) 40 MG tablet Take 40 mg by mouth every evening.      . temazepam (RESTORIL) 15 MG capsule Take 1 capsule (15 mg total) by mouth at bedtime and may repeat dose one time if needed.  10 capsule  0  . valsartan-hydrochlorothiazide (DIOVAN-HCT) 160-25 MG per tablet Take 1 tablet by mouth daily.        Scheduled: . antiseptic oral rinse  15 mL Mouth Rinse BID  . aspirin  325 mg Oral Daily  . benzonatate  100 mg Oral TID  . bisacodyl  5 mg Oral QHS  . cholecalciferol  2,000 Units Oral Daily  . citalopram  20 mg Oral Daily  . enoxaparin (LOVENOX) injection  40 mg Subcutaneous Q24H  . guaiFENesin  600 mg Oral BID  . hydrochlorothiazide  25 mg Oral Daily  . hydrocortisone   Rectal BID  . insulin aspart  0-20 Units Subcutaneous TID WC  . insulin aspart  0-5  Units Subcutaneous QHS  . insulin aspart  20 Units Subcutaneous Once  . insulin glargine  15 Units Subcutaneous QHS  . levalbuterol  0.63 mg Nebulization QID   And  . ipratropium  0.5 mg Nebulization QID  . irbesartan  150 mg Oral Daily  . levofloxacin (LEVAQUIN) IV  750 mg Intravenous Q48H  . loratadine  10 mg Oral Daily  . mometasone-formoterol  2 puff Inhalation BID  . polyethylene glycol  17 g Oral Daily  . pramipexole  0.25 mg Oral QHS  . predniSONE  50 mg Oral Q breakfast  . senna-docusate  1 tablet Oral BID  . temazepam  15 mg Oral QHS,MR X 1   Continuous: . sodium chloride 10 mL/hr at 08/09/13 0530   IZT:[id number], acetaminophen, ALPRAZolam, ALPRAZolam, levalbuterol, morphine CONCENTRATE, morphine CONCENTRATE, ondansetron (ZOFRAN) IV, ondansetron  Assessment/Plan: Principal Problem:  COPD exacerbation - patient still with significant wheezing on exam, continue treatment as outlined  Active Problems:  Respiratory failure, acute-on-chronic.  DM (diabetes mellitus), type 2, uncontrolled - partially steroid related and diet related  HTN (hypertension) - BP control this morning  Hyperlipidemia - aware  Restless legs syndrome - aware  History of NSTEMI - aware  Unspecified constipation - try Dulcolax tablets as well as Senokot S. twice daily and daily MiraLAX  Hemorrhoids - treat with hydrocortisone cream  Patient is a no CODE BLUE  Palliative care, input appreciated  Leukocytosis more than likely steroid related    LOS: 6 days   Kandice Hams 08/12/2013, 9:22 AM

## 2013-08-13 LAB — CREATININE, SERUM
Creatinine, Ser: 0.95 mg/dL (ref 0.50–1.10)
GFR calc Af Amer: 62 mL/min — ABNORMAL LOW (ref >90)
GFR calc non Af Amer: 53 mL/min — ABNORMAL LOW (ref >90)

## 2013-08-13 LAB — GLUCOSE, CAPILLARY
GLUCOSE-CAPILLARY: 258 mg/dL — AB (ref 70–99)
Glucose-Capillary: 109 mg/dL — ABNORMAL HIGH (ref 70–99)

## 2013-08-13 MED ORDER — BENZONATATE 100 MG PO CAPS: 100.0000 mg | ORAL_CAPSULE | Freq: Three times a day (TID) | ORAL | Status: AC

## 2013-08-13 MED ORDER — MORPHINE SULFATE (CONCENTRATE) 10 MG /0.5 ML PO SOLN: 5.0000 mg | ORAL | Status: AC | PRN

## 2013-08-13 MED ORDER — BISACODYL 5 MG PO TBEC: 5.0000 mg | DELAYED_RELEASE_TABLET | Freq: Every day | ORAL | Status: AC | PRN

## 2013-08-13 MED ORDER — PREDNISONE 10 MG PO TABS: 10.0000 mg | ORAL_TABLET | Freq: Every day | ORAL | Status: AC

## 2013-08-13 MED ORDER — SENNOSIDES-DOCUSATE SODIUM 8.6-50 MG PO TABS: 1.0000 | ORAL_TABLET | Freq: Two times a day (BID) | ORAL | Status: AC

## 2013-08-13 MED ORDER — ALPRAZOLAM 0.25 MG PO TABS: 0.2500 mg | ORAL_TABLET | Freq: Three times a day (TID) | ORAL | Status: AC | PRN

## 2013-08-13 MED ORDER — HYDROCORTISONE 2.5 % RE CREA: TOPICAL_CREAM | Freq: Two times a day (BID) | RECTAL | Status: AC | PRN

## 2013-08-13 NOTE — Progress Notes (Signed)
Physical Therapy Treatment Patient Details Name: LTANYA BAYLEY MRN: [id number] DOB: 02/19/1928 Today's Date: 08/13/2013    History of Present Illness Patient is 80 year old female with history of chronic respiratory failure on 2 L O2 via nasal cannula at home, COPD, hypertension, CAD presented to the ER with shortness of breath and wheezing that has worsened in the last 3 days.    PT Comments    Patient learning her limits with ambulation.  Seems confident about negotiating steps with daughter's help for home entry.  States ramp still planned, but not sure since hills are challenging.  Encouraged to work with HHPT to learn how to negotiate safely.  Follow Up Recommendations  Home health PT;Supervision - Intermittent (Hospice to assess initially for needs; likely needs [facility])     Equipment Recommendations  None recommended by PT    Recommendations for Other Services       Precautions / Restrictions Precautions Precautions: Fall Precaution Comments: most recent fall about a year ago Restrictions Weight Bearing Restrictions: No    Mobility  Bed Mobility               General bed mobility comments: nt, pt in chair  Transfers       Sit to Stand: Modified independent (Device/Increase time)         General transfer comment: use of arms on chair to stand, c/o initially numbness in legs  Ambulation/Gait Ambulation/Gait assistance: Supervision Ambulation Distance (Feet): 100 Feet Assistive device: Rolling walker (2 wheeled)       General Gait Details: pushed herself more than yesterday, but needed to sit to rest, was wheeled back in chair to her room.   Stairs            Wheelchair Mobility    Modified Rankin (Stroke Patients Only)       Balance Overall balance assessment: Needs assistance         Standing balance support: Bilateral upper extremity supported Standing balance-Leahy Scale: Fair Standing balance comment: but needs walker for  ambulation due to activity tolerance                    Cognition Arousal/Alertness: Awake/alert Behavior During Therapy: WFL for tasks assessed/performed Overall Cognitive Status: Within Functional Limits for tasks assessed                      Exercises      General Comments        Pertinent Vitals/Pain No pain complaints (denied MSO4 prior to ambulation)    Home Living                      Prior Function            PT Goals (current goals can now be found in the care plan section) Progress towards PT goals: Progressing toward goals    Frequency  Min 3X/week    PT Plan Current plan remains appropriate    Co-evaluation             End of Session Equipment Utilized During Treatment: Gait belt;Oxygen Activity Tolerance: Patient tolerated treatment well Patient left: in chair;with call bell/phone within reach     Time: 0940-1000 PT Time Calculation (min): 20 min  Charges:  $Gait Training: 8-22 mins                    G Codes:      Derek Jack  Marvis Bakken 08/13/2013, 10:53 AM Magda Kiel, PT [phone] 08/13/2013

## 2013-08-13 NOTE — Discharge Summary (Signed)
Physician Discharge Summary  NAME:[name]  OFB:[id number]  DOB: [birth year]   Admit date: 08/06/2013 Discharge date: 08/13/2013  Discharge Diagnoses:  Principal Problem:   COPD exacerbation Active Problems:   Respiratory failure, acute-on-chronic   DM (diabetes mellitus), type 2, uncontrolled   HTN (hypertension)   Hyperlipidemia   Restless legs syndrome   Anxiety state, unspecified   Unspecified constipation   Hemorrhoids   Grief reaction   DNR (do not resuscitate)   Discharge Physical Exam:  General Appearance: Alert, cooperative, no distress, appears stated age  Weight change:   Intake/Output Summary (Last 24 hours) at 08/13/13 0739 Last data filed at 08/13/13 0600  Gross per 24 hour  Intake   1276 ml  Output    950 ml  Net    326 ml   Filed Vitals:   08/12/13 1345 08/12/13 1544 08/12/13 2028 08/13/13 0444  BP: 118/66  114/67 121/65  Pulse: 83  102 77  Temp: 97.3 F (36.3 C)  97.5 F (36.4 C) 97.9 F (36.6 C)  TempSrc: Oral  Oral Oral  Resp: 18  20 20   Height:      Weight:      SpO2: 97% 99% 98% 98%     General Appearance: Alert, cooperative, mildly increased work of breathing, chronic  Lungs: Distant breath sounds throughout with minimal end expiratory wheeze  Heart: Regular rate and rhythm, S1 and S2 normal, no murmur, rub or gallop  Abdomen: Soft, non-tender, bowel sounds active all four quadrants, no masses, no organomegaly  Extremities: Extremities normal, atraumatic, no cyanosis or edema  Neuro: Nonfocal, alert  Discharge Condition: Much improved  Hospital Course: Patient is 80 year old female with history of chronic respiratory failure on 2 L O2 via nasal cannula at home, COPD, hypertension, CAD presented to the ER on April 20 with severe shortness of breath and wheezing that had worsened in the previous 3 days. History was obtained from the patient and her daughter in the room. Patient reports that she had seen her PCP a week and half prior  to admission as she was feeling generalized weakness and shortness of breath. Patient reports that nothing was found wrong with her and all her blood work came back fine. However in the 3 days prior to admission she noticed becoming short-winded with a few steps of ambulation, wheezing and cough. She also endorsed having productive cough with thick phlegm but no fevers or chills. She had been using nebulizer breathing treatments about every 4 hours at home. She was admitted and started on nebulized bronchodilators and steroids as well as intravenous site Medrol.  O2 therapy was continued.  She finally started to clear after several days.  She has become much more short of breath generally in the past few months and is having a tough time ambulating more than just a few steps without significant shortness of breath.  It is felt that she has fairly end-stage COPD and hospice and palliative care were consulted.  She does not want to continue to be rehospitalized over and over and really wants for comfort measures at home if possible if this becomes a repetitive process.  She was prescribed alprazolam with good relief of anxiety and she has Roxanol as needed for shortness of breath and/or agitation.  Currently she is doing well I will discharge home with hospice to follow at home.  I will plan to see her back in the office next week. While admitted she was also treated with Levaquin but  never did have marked productive purulent cough. She did experience hypoglycemia on high doses of steroids and she will be discharged home on oral medications for diabetes and we will institute insulin therapy if necessary.  Steroids have been reduced to 50 milligrams daily and she will taper every 3 days until she is down to 10 milligrams daily chronically.  Things to follow up in the outpatient setting: Shortness of breath, fever, blood sugars  Consults: Treatment Team:  [facility]  Disposition: Discharge to  home  Discharge Orders   Future Orders Complete By Expires   Call MD for:  difficulty breathing, headache or visual disturbances  As directed    Call MD for:  temperature >100.4  As directed    Diet - low sodium heart healthy  As directed    Discharge instructions  As directed    Increase activity slowly  As directed        Medication List    STOP taking these medications       simvastatin 40 MG tablet  Commonly known as:  ZOCOR      TAKE these medications       acetaminophen 325 MG tablet  Commonly known as:  TYLENOL  Take 2 tablets (650 mg total) by mouth every 6 (six) hours as needed for pain or fever.     ADVAIR DISKUS 250-50 MCG/DOSE Aepb  Generic drug:  Fluticasone-Salmeterol  Inhale 1 puff into the lungs every 12 (twelve) hours.     ADVIL PO  Take 2 tablets by mouth every 4 (four) hours as needed (pain).     albuterol (2.5 MG/3ML) 0.083% nebulizer solution  Commonly known as:  PROVENTIL  Take 2.5 mg by nebulization every 6 (six) hours as needed for wheezing or shortness of breath.     ALPRAZolam 0.25 MG tablet  Commonly known as:  XANAX  Take 1 tablet (0.25 mg total) by mouth 3 (three) times daily as needed for anxiety (air hunger).     aspirin 325 MG tablet  Take 325 mg by mouth daily.     benzonatate 100 MG capsule  Commonly known as:  TESSALON  Take 1 capsule (100 mg total) by mouth 3 (three) times daily.     bisacodyl 5 MG EC tablet  Commonly known as:  DULCOLAX  Take 1 tablet (5 mg total) by mouth daily as needed for moderate constipation.     budesonide 180 MCG/ACT inhaler  Commonly known as:  PULMICORT  Inhale 2 puffs into the lungs 2 (two) times daily.     citalopram 20 MG tablet  Commonly known as:  CELEXA  Take 20 mg by mouth daily.     doxepin 10 MG capsule  Commonly known as:  SINEQUAN  Take 10 mg by mouth.     hydrocortisone 2.5 % rectal cream  Commonly known as:  ANUSOL-HC  Place rectally 2 (two) times daily as needed for  hemorrhoids or itching.     loratadine 10 MG tablet  Commonly known as:  CLARITIN  Take 10 mg by mouth daily.     metFORMIN 500 MG tablet  Commonly known as:  GLUCOPHAGE  Take 500 mg by mouth 2 (two) times daily with a meal.     morphine CONCENTRATE 10 mg / 0.5 ml concentrated solution  Take 0.25 mLs (5 mg total) by mouth every 2 (two) hours as needed for severe pain, anxiety or shortness of breath.     MUCINEX 600 MG 12 hr  tablet  Generic drug:  guaiFENesin  Take 600 mg by mouth 2 (two) times daily.     polyethylene glycol packet  Commonly known as:  MIRALAX / GLYCOLAX  Take 17 g by mouth daily.     pramipexole 0.125 MG tablet  Commonly known as:  MIRAPEX  Take 0.25 mg by mouth at bedtime.     predniSONE 10 MG tablet  Commonly known as:  DELTASONE  Take 1 tablet (10 mg total) by mouth daily with breakfast. In the morning with breakfast, Take 5 pills daily for 3 days then 4 pills daily for 3 days then 3 pills daily for 3 days then 2 pills daily for 3 days and then continue one pill daily thereafter     senna-docusate 8.6-50 MG per tablet  Commonly known as:  Senokot-S  Take 1 tablet by mouth 2 (two) times daily.     temazepam 15 MG capsule  Commonly known as:  RESTORIL  Take 1 capsule (15 mg total) by mouth at bedtime and may repeat dose one time if needed.     valsartan-hydrochlorothiazide 160-25 MG per tablet  Commonly known as:  DIOVAN-HCT  Take 1 tablet by mouth daily.     Vitamin D 2000 UNITS Caps  Take 1 capsule by mouth daily.         The results of significant diagnostics from this hospitalization (including imaging, microbiology, ancillary and laboratory) are listed below for reference.    Significant Diagnostic Studies: Dg Chest 2 View (if Patient Has Fever And/or Copd)  08/06/2013   CLINICAL DATA:  Cough and congestion, 2-3 days duration.  Wheezing.  EXAM: CHEST  2 VIEW  COMPARISON:  06/28/2012  FINDINGS: Heart size is normal. There is calcification of  the thoracic aorta. There is central bronchial thickening but no evidence of infiltrate, collapse or fusion. No significant bony finding.  IMPRESSION: Bronchial thickening without consolidation or collapse.   Electronically Signed   By: Nelson Chimes M.D.   On: 08/06/2013 14:24    Microbiology: Recent Results (from the past 240 hour(s))  CULTURE, EXPECTORATED SPUTUM-ASSESSMENT     Status: None   Collection Time    08/06/13  7:57 PM      Result Value Ref Range Status   Specimen Description SPUTUM   Final   Special Requests Normal   Final   Sputum evaluation     Final   Value: THIS SPECIMEN IS ACCEPTABLE. RESPIRATORY CULTURE REPORT TO FOLLOW.   Report Status 08/06/2013 FINAL   Final  CULTURE, RESPIRATORY (NON-EXPECTORATED)     Status: None   Collection Time    08/06/13  7:57 PM      Result Value Ref Range Status   Specimen Description SPUTUM   Final   Special Requests NONE   Final   Gram Stain     Final   Value: ABUNDANT WBC PRESENT, PREDOMINANTLY PMN     NO SQUAMOUS EPITHELIAL CELLS SEEN     ABUNDANT GRAM POSITIVE RODS     FEW GRAM POSITIVE COCCI IN PAIRS     Performed at [facility]   Culture     Final   Value: ABUNDANT STREPTOCOCCUS PNEUMONIAE     Performed at [facility]   Report Status 08/10/2013 FINAL   Final   Organism ID, Bacteria STREPTOCOCCUS PNEUMONIAE   Final  MRSA PCR SCREENING     Status: None   Collection Time    08/06/13  8:44 PM  Result Value Ref Range Status   MRSA by PCR NEGATIVE  NEGATIVE Final   Comment:            The GeneXpert MRSA Assay (FDA     approved for NASAL specimens     only), is one component of a     comprehensive MRSA colonization     surveillance program. It is not     intended to diagnose MRSA     infection nor to guide or     monitor treatment for     MRSA infections.     Labs: Results for orders placed during the hospital encounter of 08/06/13  CULTURE, EXPECTORATED SPUTUM-ASSESSMENT      Result Value Ref  Range   Specimen Description SPUTUM     Special Requests Normal     Sputum evaluation       Value: THIS SPECIMEN IS ACCEPTABLE. RESPIRATORY CULTURE REPORT TO FOLLOW.   Report Status 08/06/2013 FINAL    MRSA PCR SCREENING      Result Value Ref Range   MRSA by PCR NEGATIVE  NEGATIVE  CULTURE, RESPIRATORY (NON-EXPECTORATED)      Result Value Ref Range   Specimen Description SPUTUM     Special Requests NONE     Gram Stain       Value: ABUNDANT WBC PRESENT, PREDOMINANTLY PMN     NO SQUAMOUS EPITHELIAL CELLS SEEN     ABUNDANT GRAM POSITIVE RODS     FEW GRAM POSITIVE COCCI IN PAIRS     Performed at [facility]   Culture       Value: ABUNDANT STREPTOCOCCUS PNEUMONIAE     Performed at [facility]   Report Status 08/10/2013 FINAL     Organism ID, Bacteria STREPTOCOCCUS PNEUMONIAE    BASIC METABOLIC PANEL      Result Value Ref Range   Sodium 143  137 - 147 mEq/L   Potassium 4.6  3.7 - 5.3 mEq/L   Chloride 97  96 - 112 mEq/L   CO2 30  19 - 32 mEq/L   Glucose, Bld 208 (*) 70 - 99 mg/dL   BUN 39 (*) 6 - 23 mg/dL   Creatinine, Ser 1.24 (*) 0.50 - 1.10 mg/dL   Calcium 10.8 (*) 8.4 - 10.5 mg/dL   GFR calc non Af Amer 39 (*) >90 mL/min   GFR calc Af Amer 45 (*) >90 mL/min  CBC      Result Value Ref Range   WBC 12.2 (*) 4.0 - 10.5 K/uL   RBC 3.92  3.87 - 5.11 MIL/uL   Hemoglobin 11.1 (*) 12.0 - 15.0 g/dL   HCT 35.9 (*) 36.0 - 46.0 %   MCV 91.6  78.0 - 100.0 fL   MCH 28.3  26.0 - 34.0 pg   MCHC 30.9  30.0 - 36.0 g/dL   RDW 14.9  11.5 - 15.5 %   Platelets 246  150 - 400 K/uL  PRO B NATRIURETIC PEPTIDE      Result Value Ref Range   Pro B Natriuretic peptide (BNP) 135.5  0 - 450 pg/mL  INFLUENZA PANEL BY PCR (TYPE A & B, H1N1)      Result Value Ref Range   Influenza A By PCR NEGATIVE  NEGATIVE   Influenza B By PCR NEGATIVE  NEGATIVE   H1N1 flu by pcr NOT DETECTED  NOT DETECTED  HEMOGLOBIN A1C      Result Value Ref Range   Hemoglobin A1C 7.6 (*) <  5.7 %   Mean  Plasma Glucose 171 (*) <117 mg/dL  BASIC METABOLIC PANEL      Result Value Ref Range   Sodium 139  137 - 147 mEq/L   Potassium 4.3  3.7 - 5.3 mEq/L   Chloride 96  96 - 112 mEq/L   CO2 30  19 - 32 mEq/L   Glucose, Bld 219 (*) 70 - 99 mg/dL   BUN 32 (*) 6 - 23 mg/dL   Creatinine, Ser 0.94  0.50 - 1.10 mg/dL   Calcium 9.4  8.4 - 10.5 mg/dL   GFR calc non Af Amer 54 (*) >90 mL/min   GFR calc Af Amer 63 (*) >90 mL/min  CBC      Result Value Ref Range   WBC 9.1  4.0 - 10.5 K/uL   RBC 3.40 (*) 3.87 - 5.11 MIL/uL   Hemoglobin 9.5 (*) 12.0 - 15.0 g/dL   HCT 30.6 (*) 36.0 - 46.0 %   MCV 90.0  78.0 - 100.0 fL   MCH 27.9  26.0 - 34.0 pg   MCHC 31.0  30.0 - 36.0 g/dL   RDW 14.8  11.5 - 15.5 %   Platelets 224  150 - 400 K/uL  CBC      Result Value Ref Range   WBC 9.7  4.0 - 10.5 K/uL   RBC 3.59 (*) 3.87 - 5.11 MIL/uL   Hemoglobin 10.0 (*) 12.0 - 15.0 g/dL   HCT 32.8 (*) 36.0 - 46.0 %   MCV 91.4  78.0 - 100.0 fL   MCH 27.9  26.0 - 34.0 pg   MCHC 30.5  30.0 - 36.0 g/dL   RDW 14.9  11.5 - 15.5 %   Platelets 229  150 - 400 K/uL  CREATININE, SERUM      Result Value Ref Range   Creatinine, Ser 1.09  0.50 - 1.10 mg/dL   GFR calc non Af Amer 45 (*) >90 mL/min   GFR calc Af Amer 52 (*) >90 mL/min  GLUCOSE, CAPILLARY      Result Value Ref Range   Glucose-Capillary 433 (*) 70 - 99 mg/dL  GLUCOSE, CAPILLARY      Result Value Ref Range   Glucose-Capillary 275 (*) 70 - 99 mg/dL  GLUCOSE, CAPILLARY      Result Value Ref Range   Glucose-Capillary 212 (*) 70 - 99 mg/dL  GLUCOSE, CAPILLARY      Result Value Ref Range   Glucose-Capillary 218 (*) 70 - 99 mg/dL  CBC WITH DIFFERENTIAL      Result Value Ref Range   WBC 14.0 (*) 4.0 - 10.5 K/uL   RBC 3.46 (*) 3.87 - 5.11 MIL/uL   Hemoglobin 9.6 (*) 12.0 - 15.0 g/dL   HCT 31.2 (*) 36.0 - 46.0 %   MCV 90.2  78.0 - 100.0 fL   MCH 27.7  26.0 - 34.0 pg   MCHC 30.8  30.0 - 36.0 g/dL   RDW 14.8  11.5 - 15.5 %   Platelets 262  150 - 400 K/uL    Neutrophils Relative % 90 (*) 43 - 77 %   Neutro Abs 12.5 (*) 1.7 - 7.7 K/uL   Lymphocytes Relative 6 (*) 12 - 46 %   Lymphs Abs 0.9  0.7 - 4.0 K/uL   Monocytes Relative 4  3 - 12 %   Monocytes Absolute 0.6  0.1 - 1.0 K/uL   Eosinophils Relative 0  0 - 5 %   Eosinophils  Absolute 0.0  0.0 - 0.7 K/uL   Basophils Relative 0  0 - 1 %   Basophils Absolute 0.0  0.0 - 0.1 K/uL  BASIC METABOLIC PANEL      Result Value Ref Range   Sodium 140  137 - 147 mEq/L   Potassium 4.5  3.7 - 5.3 mEq/L   Chloride 97  96 - 112 mEq/L   CO2 30  19 - 32 mEq/L   Glucose, Bld 216 (*) 70 - 99 mg/dL   BUN 37 (*) 6 - 23 mg/dL   Creatinine, Ser 1.12 (*) 0.50 - 1.10 mg/dL   Calcium 9.6  8.4 - 10.5 mg/dL   GFR calc non Af Amer 44 (*) >90 mL/min   GFR calc Af Amer 51 (*) >90 mL/min  GLUCOSE, CAPILLARY      Result Value Ref Range   Glucose-Capillary 361 (*) 70 - 99 mg/dL  GLUCOSE, CAPILLARY      Result Value Ref Range   Glucose-Capillary 294 (*) 70 - 99 mg/dL  GLUCOSE, CAPILLARY      Result Value Ref Range   Glucose-Capillary 208 (*) 70 - 99 mg/dL  GLUCOSE, CAPILLARY      Result Value Ref Range   Glucose-Capillary 206 (*) 70 - 99 mg/dL  PRO B NATRIURETIC PEPTIDE      Result Value Ref Range   Pro B Natriuretic peptide (BNP) 820.4 (*) 0 - 450 pg/mL  GLUCOSE, CAPILLARY      Result Value Ref Range   Glucose-Capillary 259 (*) 70 - 99 mg/dL  GLUCOSE, CAPILLARY      Result Value Ref Range   Glucose-Capillary 288 (*) 70 - 99 mg/dL  GLUCOSE, CAPILLARY      Result Value Ref Range   Glucose-Capillary 245 (*) 70 - 99 mg/dL  GLUCOSE, CAPILLARY      Result Value Ref Range   Glucose-Capillary 288 (*) 70 - 99 mg/dL  GLUCOSE, CAPILLARY      Result Value Ref Range   Glucose-Capillary 293 (*) 70 - 99 mg/dL  GLUCOSE, CAPILLARY      Result Value Ref Range   Glucose-Capillary 255 (*) 70 - 99 mg/dL  GLUCOSE, CAPILLARY      Result Value Ref Range   Glucose-Capillary 229 (*) 70 - 99 mg/dL  GLUCOSE, CAPILLARY       Result Value Ref Range   Glucose-Capillary 300 (*) 70 - 99 mg/dL  CBC WITH DIFFERENTIAL      Result Value Ref Range   WBC 20.7 (*) 4.0 - 10.5 K/uL   RBC 3.87  3.87 - 5.11 MIL/uL   Hemoglobin 10.7 (*) 12.0 - 15.0 g/dL   HCT 34.4 (*) 36.0 - 46.0 %   MCV 88.9  78.0 - 100.0 fL   MCH 27.6  26.0 - 34.0 pg   MCHC 31.1  30.0 - 36.0 g/dL   RDW 14.4  11.5 - 15.5 %   Platelets 351  150 - 400 K/uL   Neutrophils Relative % 87 (*) 43 - 77 %   Lymphocytes Relative 8 (*) 12 - 46 %   Monocytes Relative 5  3 - 12 %   Eosinophils Relative 0  0 - 5 %   Basophils Relative 0  0 - 1 %   Neutro Abs 18.0 (*) 1.7 - 7.7 K/uL   Lymphs Abs 1.7  0.7 - 4.0 K/uL   Monocytes Absolute 1.0  0.1 - 1.0 K/uL   Eosinophils Absolute 0.0  0.0 - 0.7 K/uL  Basophils Absolute 0.0  0.0 - 0.1 K/uL   RBC Morphology BASOPHILIC STIPPLING     WBC Morphology MILD LEFT SHIFT (1-5% METAS, OCC MYELO, OCC BANDS)    BASIC METABOLIC PANEL      Result Value Ref Range   Sodium 139  137 - 147 mEq/L   Potassium 3.9  3.7 - 5.3 mEq/L   Chloride 94 (*) 96 - 112 mEq/L   CO2 33 (*) 19 - 32 mEq/L   Glucose, Bld 210 (*) 70 - 99 mg/dL   BUN 36 (*) 6 - 23 mg/dL   Creatinine, Ser 0.79  0.50 - 1.10 mg/dL   Calcium 9.7  8.4 - 10.5 mg/dL   GFR calc non Af Amer 74 (*) >90 mL/min   GFR calc Af Amer 86 (*) >90 mL/min  GLUCOSE, CAPILLARY      Result Value Ref Range   Glucose-Capillary 300 (*) 70 - 99 mg/dL  GLUCOSE, CAPILLARY      Result Value Ref Range   Glucose-Capillary 130 (*) 70 - 99 mg/dL   Comment 1 Notify RN     Comment 2 Documented in Chart    GLUCOSE, CAPILLARY      Result Value Ref Range   Glucose-Capillary 173 (*) 70 - 99 mg/dL  GLUCOSE, CAPILLARY      Result Value Ref Range   Glucose-Capillary 254 (*) 70 - 99 mg/dL  GLUCOSE, CAPILLARY      Result Value Ref Range   Glucose-Capillary 414 (*) 70 - 99 mg/dL  GLUCOSE, RANDOM      Result Value Ref Range   Glucose, Bld 357 (*) 70 - 99 mg/dL  GLUCOSE, CAPILLARY      Result Value  Ref Range   Glucose-Capillary 290 (*) 70 - 99 mg/dL  GLUCOSE, CAPILLARY      Result Value Ref Range   Glucose-Capillary 147 (*) 70 - 99 mg/dL  GLUCOSE, CAPILLARY      Result Value Ref Range   Glucose-Capillary 141 (*) 70 - 99 mg/dL  CREATININE, SERUM      Result Value Ref Range   Creatinine, Ser 0.95  0.50 - 1.10 mg/dL   GFR calc non Af Amer 53 (*) >90 mL/min   GFR calc Af Amer 62 (*) >90 mL/min  GLUCOSE, CAPILLARY      Result Value Ref Range   Glucose-Capillary 259 (*) 70 - 99 mg/dL  GLUCOSE, CAPILLARY      Result Value Ref Range   Glucose-Capillary 329 (*) 70 - 99 mg/dL  I-STAT TROPOININ, ED      Result Value Ref Range   Troponin i, poc 0.02  0.00 - 0.08 ng/mL   Comment 3             Time coordinating discharge: 35 minutes  Signed: Henrine Screws, MD 08/13/2013, 7:39 AM

## 2013-08-13 NOTE — Progress Notes (Signed)
Notified by Ardelle Lesches, patient and family request services of [facility]) after discharge.  Spoke with pt at bedside and after this per pt request,daughter [name] on phone [phone]) to initiate education related to hospice services, philosophy and team approach to care; pt and daughter voice good understanding of information provided. They are aware pt information is being reviewed by [facility] physician for hospice eligibility. Per notes and discussion plan is to d/c today by personal car; daughter [name] informed she will be at hospital late afternoon. *Please send completed GOLD DNR Form home with pt's paperwork  DME needs discussed -per pt she has Oxygen and Neb machine already in the home as well as Rollator, RW and transport chair; * daughter requests a travel O2 tank be brought to the room for transport home pt currently on O2 @ [facility] continuous  Initial paperwork faxed to [facility]  Please notify [facility] when patient is ready to leave unit at d/c call [phone] (or [phone] if after 5 pm);  [facility] information and contact numbers given to pt, [place] at bedside during visit and via t/c to daughter [name]  Above information shared with [facility] Neoma Laming Please call with any questions or concerns   Danton Sewer, RN 08/13/2013, 9:21 AM [facility] RN Liaison [phone]

## 2014-11-06 IMAGING — CR DG CHEST 2V
2 series · 2 of 2 positions shown · non-contrast
Comparison: Radiographs dated [DATE] and 06/21/2012 and 06/12/2010

CLINICAL DATA: Right hilar prominence and faint densities in the
right lower lobe and right middle lobe on chest x-ray dated
06/21/2012

CHEST - 2 VIEW

[x chest ap]
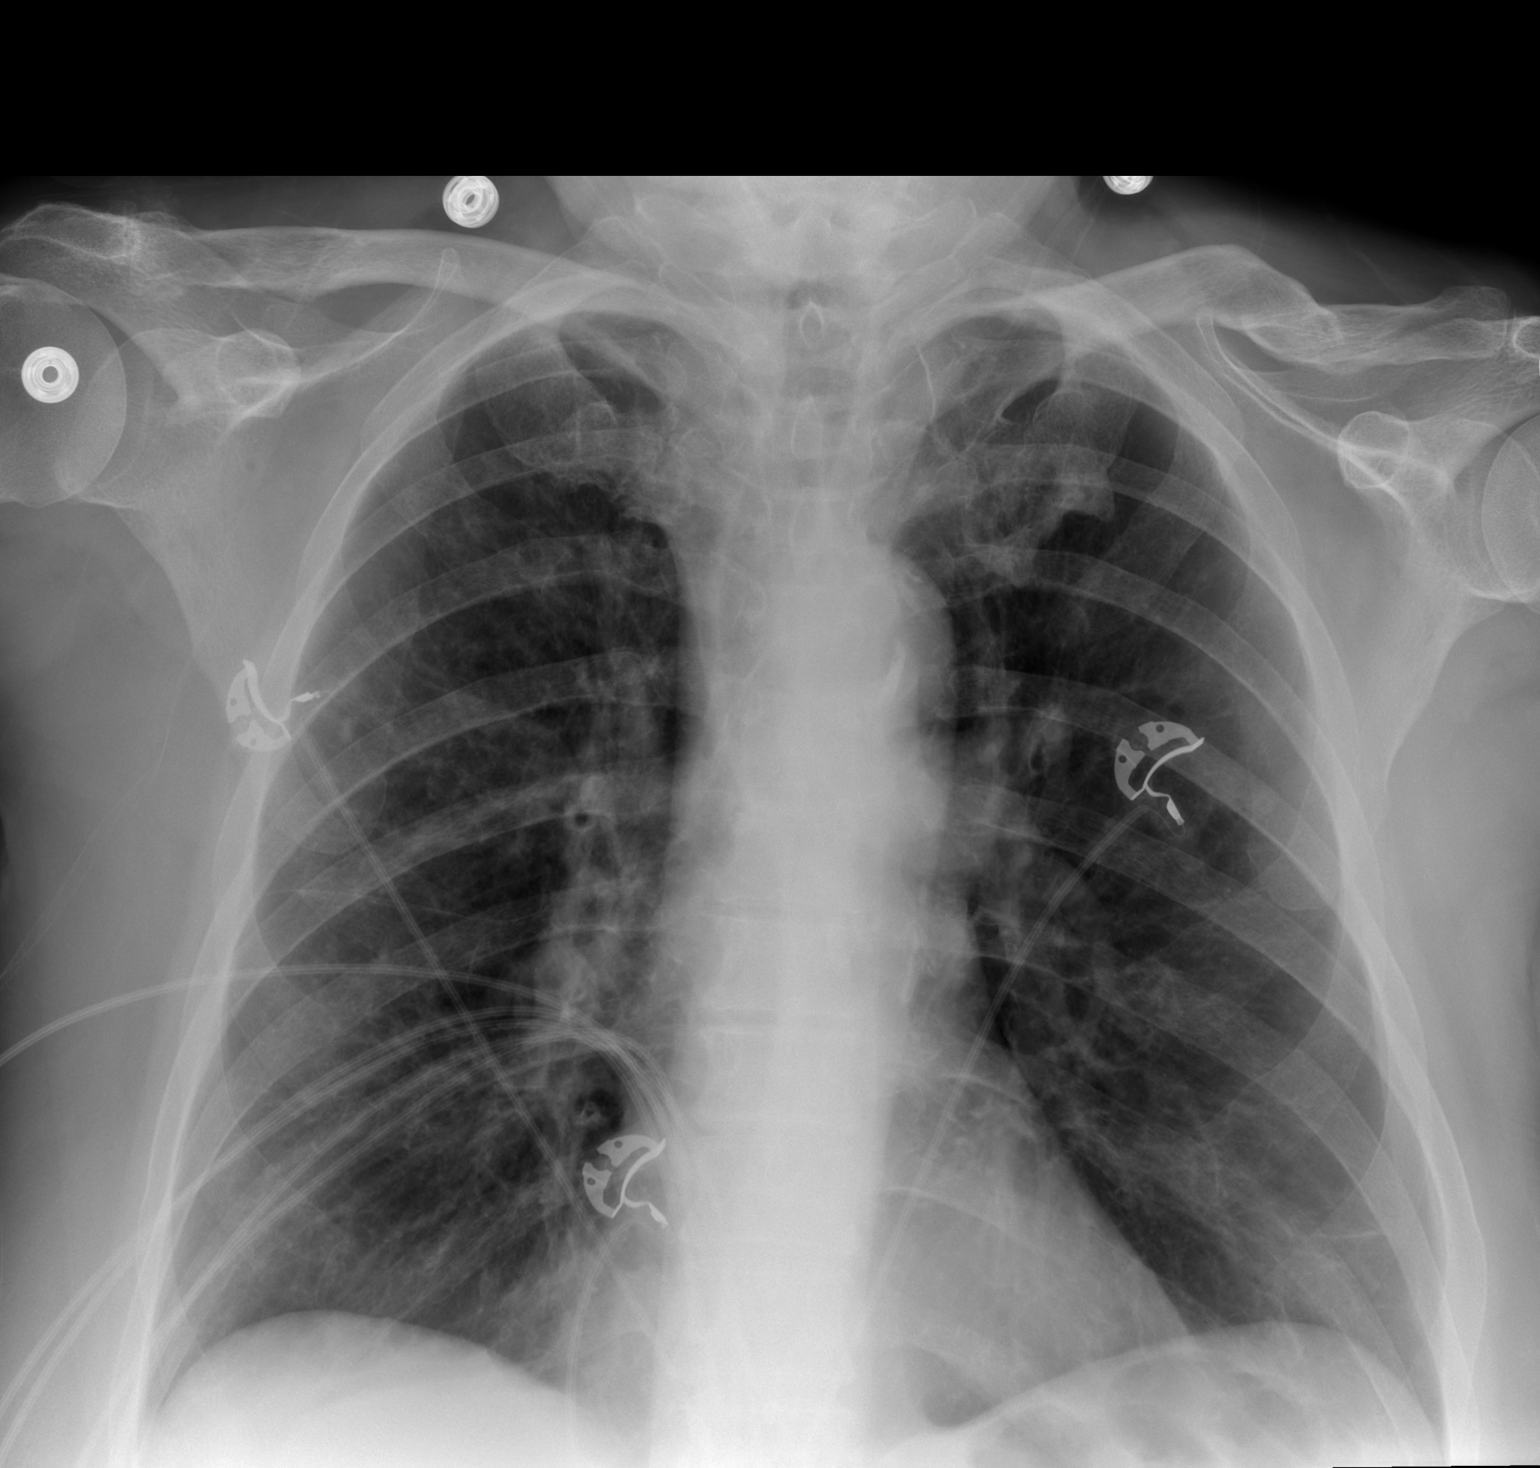

[w chest lat]
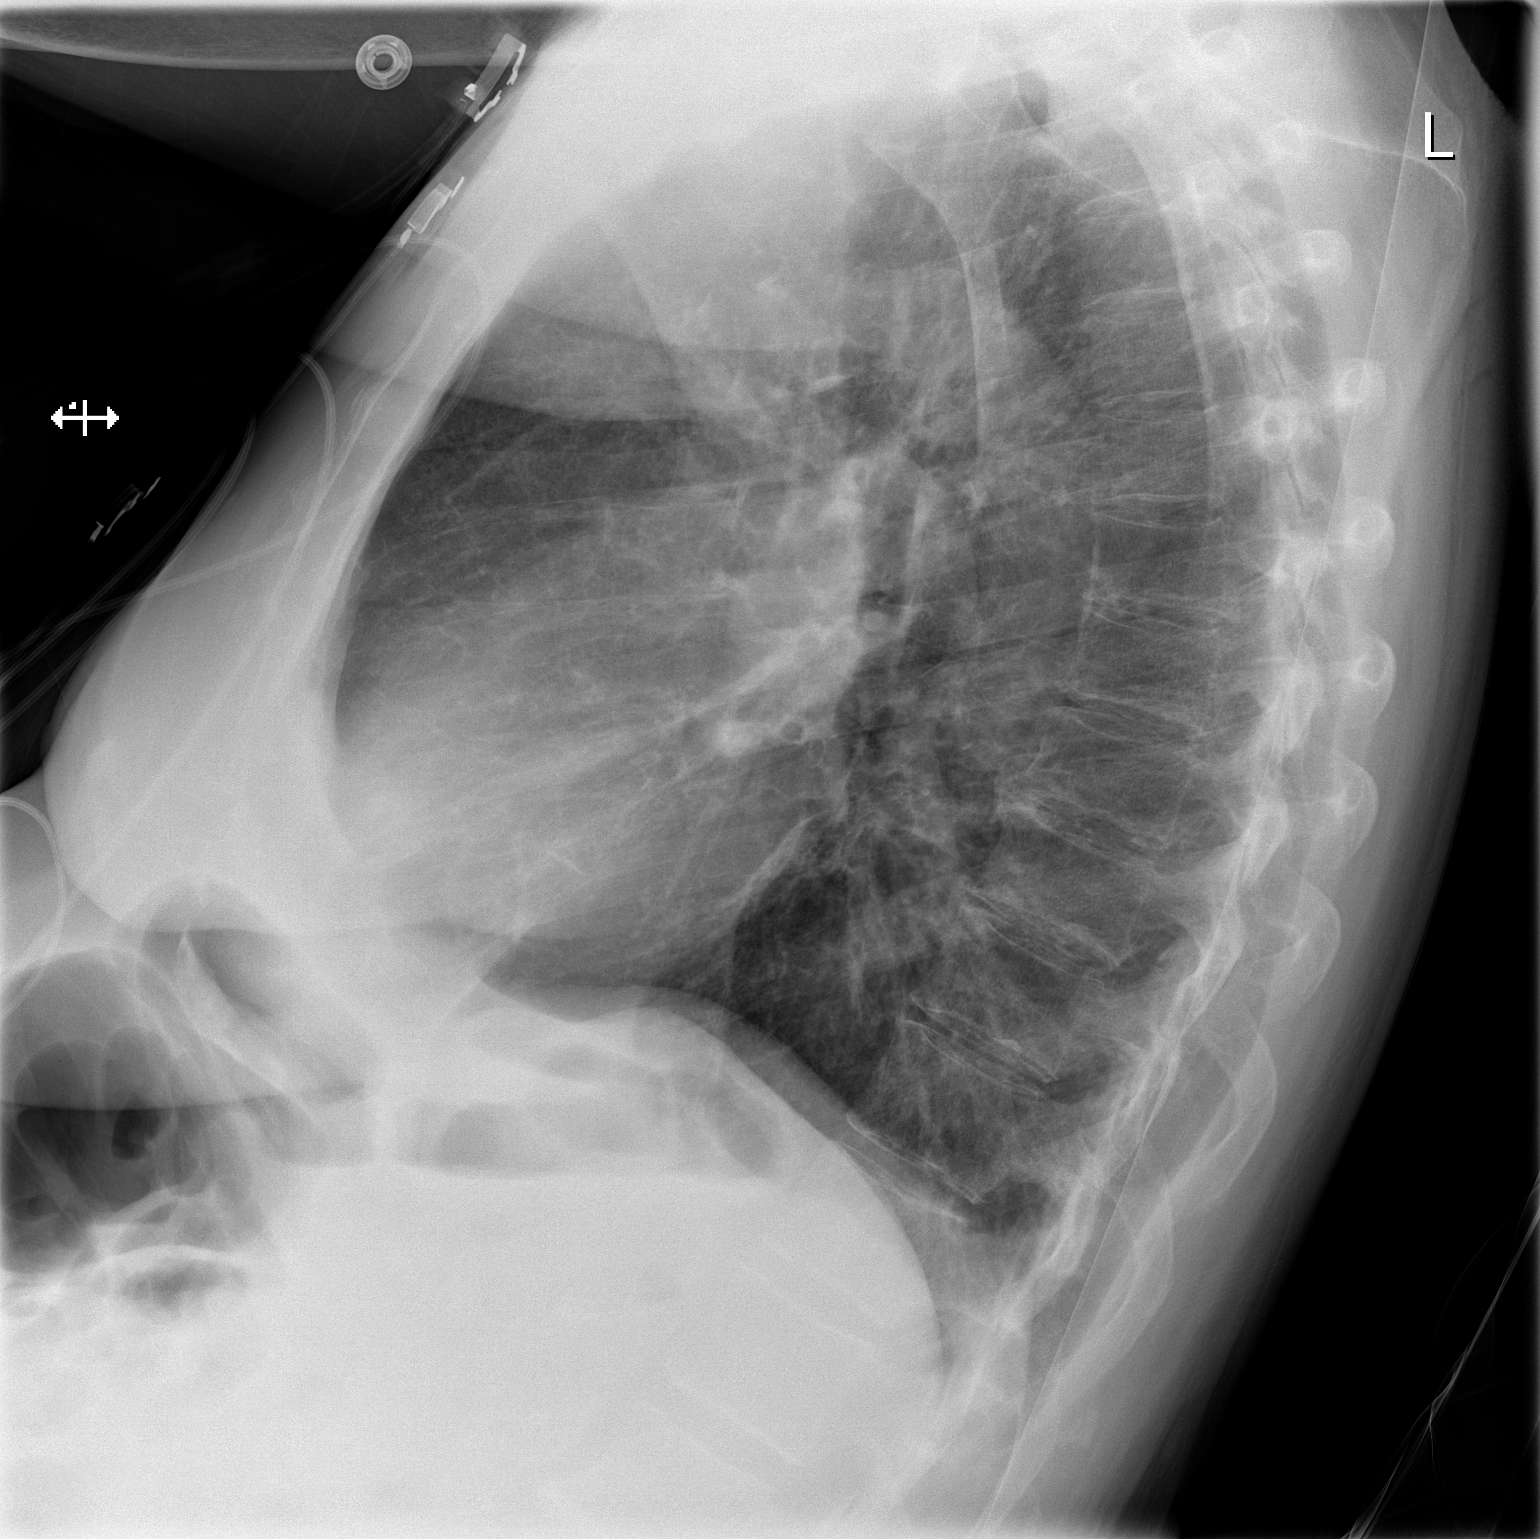

[2 of 2 positions shown; findings below may reference images not displayed]

FINDINGS: The faint infiltrates in the right lung have resolved.
There is a prominent peribronchial thickening bilaterally
consistent with acute or chronic bronchitis.  No effusions.  The
main pulmonary arteries are chronically prominent.  No appreciable
adenopathy.  Heart size is normal.  No acute osseous abnormality.
Old left clavicle fracture.
IMPRESSION: 1.  Resolution of infiltrates in the right lung.
2.  No hilar adenopathy.
3.  Prominent peribronchial thickening which could represent
recurrent acute or chronic bronchitis.

## 2015-04-20 DEATH — deceased
